# Patient Record
Sex: Female | Born: 1990 | Race: Black or African American | Hispanic: No | Marital: Single | Smoking: Former smoker
Health system: Southern US, Community
[De-identification: ages and names within clinical notes are randomized; demographics above are authoritative.]

## PROBLEM LIST (undated history)

## (undated) ENCOUNTER — Inpatient Hospital Stay (HOSPITAL_COMMUNITY): Payer: Self-pay

## (undated) DIAGNOSIS — Z8679 Personal history of other diseases of the circulatory system: Secondary | ICD-10-CM

## (undated) DIAGNOSIS — L0390 Cellulitis, unspecified: Secondary | ICD-10-CM

## (undated) DIAGNOSIS — N939 Abnormal uterine and vaginal bleeding, unspecified: Secondary | ICD-10-CM

## (undated) DIAGNOSIS — Z973 Presence of spectacles and contact lenses: Secondary | ICD-10-CM

## (undated) HISTORY — PX: NO PAST SURGERIES: SHX2092

---
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
## 2011-07-22 NOTE — L&D Delivery Note (Signed)
Delivery Note At 7:49 PM a viable and healthy female was delivered via Vaginal, Spontaneous Delivery (Presentation: ; Occiput Anterior).  APGAR: 8, 8; weight 5 lb 12.8 oz (2631 g).   Placenta status: Intact, Spontaneous.  Cord: 3 vessels with a loose nuchal cord that reduced with delivery of the body.   Patient delivered the infants head to crowning which was delivered by RN, I attended the delivery of the body.  The infant cried but was noted to have wet respiratory sounds and thick meconium was noted. The cord was clamped and cut and the infant was passed to the isolet and the waiting RN. The placenta delivered spontaneously, intact, with 3V cord.  A second degree laceration was repaired with 3-0 vicryl.  Mom is doing well after delivery and the infant was passed to the nursery d/t poor oxygen saturations.    Anesthesia: Epidural  Episiotomy: None Lacerations: 2nd degree Suture Repair: 3.0 vicryl Est. Blood Loss (mL):   Mom to postpartum.  Baby to nursery-stable.  Khalfani Weideman H. 03/31/2012, 8:24 PM

## 2011-08-18 ENCOUNTER — Emergency Department (HOSPITAL_COMMUNITY): Payer: Self-pay

## 2011-08-18 ENCOUNTER — Encounter (HOSPITAL_COMMUNITY): Payer: Self-pay

## 2011-08-18 ENCOUNTER — Emergency Department (HOSPITAL_COMMUNITY)
Admission: EM | Admit: 2011-08-18 | Discharge: 2011-08-18 | Disposition: A | Discharge status: Home / Self Care | Payer: Self-pay | Attending: Emergency Medicine | Admitting: Emergency Medicine

## 2011-08-18 DIAGNOSIS — R1033 Periumbilical pain: Secondary | ICD-10-CM | POA: Insufficient documentation

## 2011-08-18 DIAGNOSIS — O468X9 Other antepartum hemorrhage, unspecified trimester: Secondary | ICD-10-CM | POA: Insufficient documentation

## 2011-08-18 DIAGNOSIS — O418X90 Other specified disorders of amniotic fluid and membranes, unspecified trimester, not applicable or unspecified: Secondary | ICD-10-CM

## 2011-08-18 DIAGNOSIS — O200 Threatened abortion: Secondary | ICD-10-CM | POA: Insufficient documentation

## 2011-08-18 DIAGNOSIS — R10815 Periumbilic abdominal tenderness: Secondary | ICD-10-CM | POA: Insufficient documentation

## 2011-08-18 DIAGNOSIS — F17200 Nicotine dependence, unspecified, uncomplicated: Secondary | ICD-10-CM | POA: Insufficient documentation

## 2011-08-18 LAB — URINALYSIS, ROUTINE W REFLEX MICROSCOPIC
Glucose, UA: NEGATIVE mg/dL
Ketones, ur: 15 mg/dL — AB
Nitrite: NEGATIVE
Protein, ur: 30 mg/dL — AB
Specific Gravity, Urine: 1.029 (ref 1.005–1.030)
Urobilinogen, UA: 1 mg/dL (ref 0.0–1.0)
pH: 7.5 (ref 5.0–8.0)

## 2011-08-18 LAB — WET PREP, GENITAL
Trich, Wet Prep: NONE SEEN
Yeast Wet Prep HPF POC: NONE SEEN

## 2011-08-18 LAB — URINE MICROSCOPIC-ADD ON

## 2011-08-18 LAB — HCG, QUANTITATIVE, PREGNANCY: hCG, Beta Chain, Quant, S: 105857 m[IU]/mL — ABNORMAL HIGH (ref <5)

## 2011-08-18 LAB — TYPE AND SCREEN
ABO/RH(D): A POS
Antibody Screen: NEGATIVE

## 2011-08-18 LAB — ABO/RH: ABO/RH(D): A POS

## 2011-08-18 NOTE — ED Provider Notes (Signed)
History    21 year-old female with vaginal bleeding and abdominal pain. Onset this morning. Bright red blood and clots. Periumbilical crampy abdominal pain. Relatively constant. Patient states she is pregnant. Last menstrual period was November 26. Has not had OB follow-up yet. First pregnancy. Denies history of abdominal surgery. No urinary complaints. No dizziness, lightheadedness or SOB. No unusual bleeding anywhere else.  CSN: [id number]  Arrival date & time 08/18/11  1337   First MD Initiated Contact with Patient 08/18/11 1456      Chief Complaint  Patient presents with  . Vaginal Bleeding    (Consider location/radiation/quality/duration/timing/severity/associated sxs/prior treatment) HPI  History reviewed. No pertinent past medical history.  History reviewed. No pertinent past surgical history.  No family history on file.  History  Substance Use Topics  . Smoking status: Current Everyday Smoker  . Smokeless tobacco: Not on file  . Alcohol Use: Yes    OB History    Grav Para Term Preterm Abortions TAB SAB Ect Mult Living   1               Review of Systems  Review of symptoms negative unless otherwise noted in HPI.  Allergies  Review of patient's allergies indicates no known allergies.  Home Medications   Current Outpatient Rx  Name Route Sig Dispense Refill  . PRENATAL PO Oral Take 1 tablet by mouth daily.      BP 115/66  Pulse 97  Temp(Src) 98.1 F (36.7 C) (Oral)  Resp 18  Ht 5\' 3"  (1.6 m)  Wt 120 lb (54.432 kg)  BMI 21.26 kg/m2  SpO2 99%  LMP 06/16/2011  Physical Exam  Nursing note and vitals reviewed. Constitutional: She appears well-developed and well-nourished. No distress.       Sitting up in bed. No acute distress  HENT:  Head: Normocephalic and atraumatic.  Eyes: Conjunctivae are normal. Right eye exhibits no discharge. Left eye exhibits no discharge.  Neck: Neck supple.  Cardiovascular: Normal rate, regular rhythm and normal heart  sounds.  Exam reveals no gallop and no friction rub.   No murmur heard. Pulmonary/Chest: Effort normal and breath sounds normal. No respiratory distress.  Abdominal: Soft. She exhibits no distension. There is tenderness.       Abdomen normal to inspection. Mild periumbilical tenderness without rebound or guarding. No distention.  Genitourinary:       New CVA tenderness. Normal external female genitalia. No concerning lesions noted. Scant whitish vaginal discharge, likely physiologic. Cervix closed & without lesion. No blood at os noted. No blood noted and actually fall. No adnexal masses palpated. No CMT.  Musculoskeletal: She exhibits no edema and no tenderness.  Neurological: She is alert.  Skin: Skin is warm and dry.  Psychiatric: She has a normal mood and affect. Her behavior is normal. Thought content normal.    ED Course  Procedures (including critical care time)  Labs Reviewed  HCG, QUANTITATIVE, PREGNANCY - Abnormal; Notable for the following:    hCG, Beta Chain, [place] Longest [id number] (*)    All other components within normal limits  URINALYSIS, ROUTINE W REFLEX MICROSCOPIC - Abnormal; Notable for the following:    APPearance CLOUDY (*)    Hgb urine dipstick LARGE (*)    Bilirubin Urine SMALL (*)    Ketones, ur 15 (*)    Protein, ur 30 (*)    Leukocytes, UA MODERATE (*)    All other components within normal limits  URINE MICROSCOPIC-ADD ON - Abnormal; Notable for the  following:    Squamous Epithelial / LPF MANY (*)    Bacteria, UA MANY (*)    Casts HYALINE CASTS (*)    All other components within normal limits  WET PREP, GENITAL - Abnormal; Notable for the following:    Clue Cells, Wet Prep FEW (*)    WBC, Wet Prep HPF POC FEW (*)    All other components within normal limits  TYPE AND SCREEN  GC/CHLAMYDIA PROBE AMP, GENITAL  ABO/RH   US Ob Comp Less 14 Wks  08/18/2011  *RADIOLOGY REPORT*  Clinical Data: Pain and bleeding for 1 day, pregnant; quantitative beta HCG 105,157   OBSTETRIC <14 WK [place] AND TRANSVAGINAL OB US  Technique:  Both transabdominal and transvaginal ultrasound examinations were performed for complete evaluation of the gestation as well as the maternal uterus, adnexal regions, and pelvic cul-de-sac.  Transvaginal technique was performed to assess early pregnancy.  Comparison:  None.  Intrauterine gestational sac:  Visualized/normal in shape. Yolk sac: Present Embryo: Present Cardiac Activity: Present Heart Rate: 162 bpm  CRL: 15.6 mm     8 w  0 d           [place] EDC: 03/29/2012  Maternal uterus/adnexae: Small subchorionic hemorrhage. Right ovary measures 3.4 x 3.9 x 3.1 cm and contains a small complicated nodule 1.6 x 1.2 x 1.4 cm, question hemorrhagic corpus luteal cyst. Left ovary normal size and morphology, 2.0 x 1.6 x 3.0 cm. Small amount of nonspecific free pelvic fluid.  IMPRESSION: Single live early intrauterine gestation measured at 8 weeks 0 days EGA. Small subchorionic hemorrhage.  Original Report Authenticated By: Lollie Marrow, M.D.   US Ob Transvaginal  08/18/2011  *RADIOLOGY REPORT*  Clinical Data: Pain and bleeding for 1 day, pregnant; quantitative beta HCG 105,157  OBSTETRIC <14 WK [place] AND TRANSVAGINAL OB US  Technique:  Both transabdominal and transvaginal ultrasound examinations were performed for complete evaluation of the gestation as well as the maternal uterus, adnexal regions, and pelvic cul-de-sac.  Transvaginal technique was performed to assess early pregnancy.  Comparison:  None.  Intrauterine gestational sac:  Visualized/normal in shape. Yolk sac: Present Embryo: Present Cardiac Activity: Present Heart Rate: 162 bpm  CRL: 15.6 mm     8 w  0 d           [place] EDC: 03/29/2012  Maternal uterus/adnexae: Small subchorionic hemorrhage. Right ovary measures 3.4 x 3.9 x 3.1 cm and contains a small complicated nodule 1.6 x 1.2 x 1.4 cm, question hemorrhagic corpus luteal cyst. Left ovary normal size and morphology, 2.0 x 1.6 x 3.0 cm. Small amount of  nonspecific free pelvic fluid.  IMPRESSION: Single live early intrauterine gestation measured at 8 weeks 0 days EGA. Small subchorionic hemorrhage.  Original Report Authenticated By: Lollie Marrow, M.D.     1. Threatened abortion   2. Subchorionic hemorrhage       MDM  21 year old female with vaginal bleeding. First trimester pregnancy. Ultrasound performed which showed a single live intrauterine pregnancy. Patient seemed medically stable. Patient is Rh+. Reasonable expected management discussed. Continued prenatal care. Refill. Return precautions discussed.       Raeford Razor, MD 08/27/11 [phone]

## 2011-08-18 NOTE — ED Notes (Signed)
Pelvic cart set up in CDU room 11

## 2011-08-18 NOTE — ED Notes (Signed)
Pt. Is [redacted] weeks pregnant and is  Began having mild bleeding and cramping,  Pt. Reports. "I have seen blood clots"

## 2011-08-19 LAB — GC/CHLAMYDIA PROBE AMP, GENITAL
Chlamydia, DNA Probe: NEGATIVE
GC Probe Amp, Genital: NEGATIVE

## 2011-09-04 ENCOUNTER — Encounter (HOSPITAL_COMMUNITY): Payer: Self-pay

## 2011-09-04 ENCOUNTER — Emergency Department (INDEPENDENT_AMBULATORY_CARE_PROVIDER_SITE_OTHER)
Admission: EM | Admit: 2011-09-04 | Discharge: 2011-09-04 | Disposition: A | Discharge status: Home / Self Care | Payer: Medicaid Other | Source: Home / Self Care | Attending: Emergency Medicine | Admitting: Emergency Medicine

## 2011-09-04 DIAGNOSIS — O26899 Other specified pregnancy related conditions, unspecified trimester: Secondary | ICD-10-CM

## 2011-09-04 DIAGNOSIS — O2686 Pruritic urticarial papules and plaques of pregnancy (PUPPP): Secondary | ICD-10-CM

## 2011-09-04 DIAGNOSIS — L988 Other specified disorders of the skin and subcutaneous tissue: Secondary | ICD-10-CM

## 2011-09-04 NOTE — ED Notes (Signed)
Pt c/o rash on entire body.  SX onset 1 month ago.  Pt states it originated on her hands and spread to rest of body.  Pt states she TX with Cortisone 10 with minimal relief.  Pt states she is [redacted] wks pregnant.

## 2011-09-04 NOTE — ED Provider Notes (Signed)
Chief Complaint  Patient presents with  . Rash    History of Present Illness:   Bailey Baker is a 43 to who is about [redacted] weeks pregnant. For the past month she's had a generalized, pruritic rash. This first began on her hands and now has spread to her entire body. She describes pruritic papules and extreme itching. She denies any history of eczema. She's not taking any new medications. Her only medical issue right now is her pregnancy. She's had no suspicious exposures. No one else at home or in her family has had a similar rash.  Review of Systems:  Other than noted above, the patient denies any of the following symptoms: Systemic:  No fever, chills, sweats, weight loss, or fatigue. ENT:  No nasal congestion, rhinorrhea, sore throat, swelling of lips, tongue or throat. Resp:  No cough, wheezing, or shortness of breath. Skin:  No rash, itching, nodules, or suspicious lesions.  PMFSH:  Past medical history, family history, social history, meds, and allergies were reviewed.  Physical Exam:   Vital signs:  BP 129/75  Pulse 116  Temp(Src) 98.8 F (37.1 C) (Oral)  Resp 16  SpO2 100%  LMP 06/16/2011 Gen:  Alert, oriented, in no distress. Skin:  Her skin shows a generalized maculopapular rash on her hands, arms, neck, and trunk. She has a few on her face, and no visible lesions on her legs but she says the legs are still very itchy. Many of these are eczematized.  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  The following meds were prescribed:   New Prescriptions   No medications on file   2.  The patient was instructed in symptomatic care and handouts were given. 3.  The patient was told to return if becoming worse in any way, if no better in 3 or 4 days, and given some red flag symptoms that would indicate earlier  return. 4.   She was told to use diphenhydramine 25 mg every 4 hours as needed for the itching and use a mild moisturizing ointment such as Aquaphor after bathing as well as use of a mild soap. She was told to followup with her gynecologist at her next visit.     Roque Lias, MD 09/04/11 [phone]

## 2011-09-04 NOTE — Discharge Instructions (Signed)
Pruritic Urticarial Papules and Plaques of Pregnancy  When you are pregnant, your body changes in many ways. That includes the skin. Rashes sometimes develop. The most common skin rash during pregnancy is called pruritic urticarial papules and plaques of pregnancy (PUPPP). The small red bumps sometimes form large plaques. These are very itchy. The rash usually appears in the last few weeks of pregnancy during the third trimester. Sometimes, it can occur shortly after giving birth. It goes away within 1 to 2 months after delivery. It is not harmful. You may not like the way it looks or feels. However, there is no reason to believe that it does any harm to your baby. It will not leave scars on your skin.  CAUSES   Exactly why this skin rash develops is not known.   Your skin stretches rapidly when you are pregnant. This may cause PUPPP. Stretching happens most over the belly. Skin also stretches in other areas, including the legs, hips, and breasts.   Some women are more likely than others to develop PUPPP. They include:   Those who are having a baby for the first time.   Women who gain too much weight.   Those who are carrying more than 1 baby.  SYMPTOMS    Appearance of a red, raised rash.   It might look like hives or an allergic reaction.   Sometimes, there are tiny blisters in the center of the patches.   The skin around the rash is often pale.   The rash is usually on the belly. It can spread to other parts of the body, such as the thighs or arms.   Severe itching.  DIAGNOSIS   To decide if you have PUPPP, your caregiver may:   Ask about symptoms you have noticed.   Ask about your overall health, today and in the past.   Ask about your pregnancy.   Ask whether you have been near anything that could cause an allergic reaction.   Examine the skin on parts of the body that concern you.   Order a biopsy. A small sample of skin tissue is removed and looked at under a microscope.   Order blood tests.  These may rule out other reasons for a rash.  TREATMENT   The goal is to stop the itching and keep the rash from spreading. Usually, a cream is used to do this. However, treatment varies. Common options include:   Topical corticosteroids. These are powerful drugs that can help stop itching.   Usually, the drug is used as a cream or ointment. It is put directly on the rash.   Sometimes, a stronger oral corticosteroid is needed. This is taken as a pill.   Antihistamines. These drugs are sometimes given for PUPPP. They are oral medicines. They are normally used for allergies, but they may help with the intense itching that can be seen in PUPPP.  Treatment helps nearly all women with PUPPP. The creams or pills should make your skin feel better fairly quickly. The rash and itching should be gone a few months after your baby is born.  HOME CARE INSTRUCTIONS    Do not scratch the rash.   Wear loose clothing.   Apply any creams that your caregiver prescribed. Follow the directions carefully.   Keep all follow-up appointments with your caregiver. This will allow your caregiver to make sure your treatment is working.   Learn more about skin care. You might want to meet with a skin   while it stretches during pregnancy. Also, ask about choosing skin products that are right for you.  SEEK MEDICAL CARE IF:   You have any questions about your medicines.   The itching continues or the rash continues to spread.   You continue to be uncomfortable or unable to sleep. Your caregiver may want to change your medicine.  Document Released: 10/01/2009 Document Revised: 03/19/2011 Document Reviewed: 10/01/2009 [facility] Patient Information 2012 [place], [place].   For itching take diphenhydramine 25 mg every 4 hours as needed.  Apply Aquphore ointment to skin daily after bathing and use mild soap such as [employer].

## 2011-09-15 ENCOUNTER — Encounter (HOSPITAL_COMMUNITY): Payer: Self-pay | Admitting: Emergency Medicine

## 2011-09-15 ENCOUNTER — Emergency Department (HOSPITAL_COMMUNITY)
Admission: EM | Admit: 2011-09-15 | Discharge: 2011-09-15 | Disposition: A | Discharge status: Home / Self Care | Payer: Medicaid Other | Attending: Emergency Medicine | Admitting: Emergency Medicine

## 2011-09-15 DIAGNOSIS — L0390 Cellulitis, unspecified: Secondary | ICD-10-CM

## 2011-09-15 DIAGNOSIS — B86 Scabies: Secondary | ICD-10-CM | POA: Insufficient documentation

## 2011-09-15 DIAGNOSIS — R21 Rash and other nonspecific skin eruption: Secondary | ICD-10-CM | POA: Insufficient documentation

## 2011-09-15 DIAGNOSIS — L299 Pruritus, unspecified: Secondary | ICD-10-CM | POA: Insufficient documentation

## 2011-09-15 DIAGNOSIS — L0291 Cutaneous abscess, unspecified: Secondary | ICD-10-CM | POA: Insufficient documentation

## 2011-09-15 DIAGNOSIS — O99891 Other specified diseases and conditions complicating pregnancy: Secondary | ICD-10-CM | POA: Insufficient documentation

## 2011-09-15 MED ORDER — CEPHALEXIN 250 MG PO CAPS: 250.0000 mg | ORAL_CAPSULE | Freq: Four times a day (QID) | ORAL | Status: AC

## 2011-09-15 MED ORDER — PERMETHRIN 5 % EX CREA: TOPICAL_CREAM | CUTANEOUS | Status: AC

## 2011-09-15 NOTE — ED Provider Notes (Signed)
Medical screening examination/treatment/procedure(s) were performed by non-physician practitioner and as supervising physician I was immediately available for consultation/collaboration.  Shelda Jakes, MD 09/15/11 [phone]

## 2011-09-15 NOTE — ED Notes (Signed)
Pt c/o rash to left arm, stomach and buttocks that is painful; pt denies drainage; pt sts x several weeks

## 2011-09-15 NOTE — ED Notes (Signed)
Reports rash started initially to hands & has spread everywhere else. States there are other family members with same.

## 2011-09-15 NOTE — Discharge Instructions (Signed)
Scabies Scabies are small bugs (mites) that burrow under the skin and cause red bumps and severe itching. These bugs can only be seen with a microscope. Scabies are highly contagious. They can spread easily from person to person by direct contact. They are also spread through sharing clothing or linens that have the scabies mites living in them. It is not unusual for an entire family to become infected through shared towels, clothing, or bedding.  HOME CARE INSTRUCTIONS   Your caregiver may prescribe a cream or lotion to kill the mites. If this cream is prescribed; massage the cream into the entire area of the body from the neck to the bottom of both feet. Also massage the cream into the scalp and face if your child is less than 20 year old. Avoid the eyes and mouth.   Leave the cream on for 8 to12 hours. Do not wash your hands after application. Your child should bathe or shower after the 8 to 12 hour application period. Sometimes it is helpful to apply the cream to your child at right before bedtime.   One treatment is usually effective and will eliminate approximately 95% of infestations. For severe cases, your caregiver may decide to repeat the treatment in 1 week. Everyone in your household should be treated with one application of the cream.   New rashes or burrows should not appear after successful treatment within 24 to 48 hours; however the itching and rash may last for 2 to 4 weeks after successful treatment. If your symptoms persist longer than this, see your caregiver.   Your caregiver also may prescribe a medication to help with the itching or to help the rash go away more quickly.   Scabies can live on clothing or linens for up to 3 days. Your entire child's recently used clothing, towels, stuffed toys, and bed linens should be washed in hot water and then dried in a dryer for at least 20 minutes on high heat. Items that cannot be washed should be enclosed in a plastic bag for at least 3  days.   To help relieve itching, bathe your child in a cool bath or apply cool washcloths to the affected areas.   Your child may return to school after treatment with the prescribed cream.  SEEK MEDICAL CARE IF:   The itching persists longer than 4 weeks after treatment.   The rash spreads or becomes infected (the area has red blisters or yellow-tan crust).  Document Released: 07/07/2005 Document Revised: 03/19/2011 Document Reviewed: 11/15/2008 [facility] Patient Information 2012 [place], [place].  Cellulitis Cellulitis is an infection of the skin and the tissue beneath it. The area is typically red and tender. It is caused by germs (bacteria) (usually staph or strep) that enter the body through cuts or sores. Cellulitis most commonly occurs in the arms or lower legs.  HOME CARE INSTRUCTIONS   If you are given a prescription for medications which kill germs (antibiotics), take as directed until finished.   If the infection is on the arm or leg, keep the limb elevated as able.   Use a warm cloth several times per day to relieve pain and encourage healing.   See your caregiver for recheck of the infected site as directed if problems arise.   Only take over-the-counter or prescription medicines for pain, discomfort, or fever as directed by your caregiver.  SEEK MEDICAL CARE IF:   The area of redness (inflammation) is spreading, there are red streaks coming from the  infected site, or if a part of the infection begins to turn dark in color.   The joint or bone underneath the infected skin becomes painful after the skin has healed.   The infection returns in the same or another area after it seems to have gone away.   A boil or bump swells up. This may be an abscess.   New, unexplained problems such as pain or fever develop.  SEEK IMMEDIATE MEDICAL CARE IF:   You have a fever.   You or your child feels drowsy or lethargic.   There is vomiting, diarrhea, or lasting discomfort or  feeling ill (malaise) with muscle aches and pains.  MAKE SURE YOU:   Understand these instructions.   Will watch your condition.   Will get help right away if you are not doing well or get worse.  Document Released: 04/16/2005 Document Revised: 03/19/2011 Document Reviewed: 02/23/2008 [facility] Patient Information 2012 [place], [place].

## 2011-09-15 NOTE — ED Provider Notes (Signed)
History     CSN: [id number]  Arrival date & time 09/15/11  5621   First MD Initiated Contact with Patient 09/15/11 [phone]      No chief complaint on file.   (Consider location/radiation/quality/duration/timing/severity/associated sxs/prior treatment) Patient is a 21 y.o. female presenting with rash. The history is provided by the patient. No language interpreter was used.  Rash     21 year old female who is [redacted] weeks pregnant is presenting to the ED with chief complaints of rash. Patient states for the past one to 2 months she has been experiencing a generalized pruritic rash throughout the body. Rash initially started from both hands and then spread throughout her body. Rash is very itchy. She now noticed several area of rash on her body this not only itchy but tender to touch. Patient denies any medication changes have a new pets, or new environmental changes. Patient denies fever, nausea, vomiting, lip swelling, cp, sob, abd pain. There is similar rash noted to other family members.  Patient states last week she was seen at urgent care for her rash, and was diagnosed with PUPPP.  She was given topical steroid and Aquafor cream but sts it has not improved.    Past Medical History  Diagnosis Date  . Asthma     No past surgical history on file.  No family history on file.  History  Substance Use Topics  . Smoking status: Former Games developer  . Smokeless tobacco: Not on file  . Alcohol Use: No    OB History    Grav Para Term Preterm Abortions TAB SAB Ect Mult Living   1               Review of Systems  Skin: Positive for rash.  All other systems reviewed and are negative.    Allergies  Review of patient's allergies indicates no known allergies.  Home Medications   Current Outpatient Rx  Name Route Sig Dispense Refill  . DIPHENHYDRAMINE HCL 12.5 MG/5ML PO LIQD Oral Take 25 mg by mouth every 4 (four) hours as needed. As needed for rash and itching.    Marland Kitchen PRENATAL PO Oral  Take 1 tablet by mouth daily.      LMP 06/16/2011  Physical Exam  Nursing note and vitals reviewed. Constitutional: She appears well-developed and well-nourished. No distress.  HENT:  Head: Normocephalic and atraumatic.  Mouth/Throat: Oropharynx is clear and moist. No oropharyngeal exudate.       No oral mucosal edema. Non-petechial  Eyes: Conjunctivae are normal.  Neck: Normal range of motion. Neck supple.  Musculoskeletal: Normal range of motion.  Lymphadenopathy:    She has no cervical adenopathy.  Neurological: She is alert.  Skin: Skin is warm and dry.        diffuse maculopapular rash noted to the webspace of hands bilaterally, forearms, chest, back, buttocks, and lower legs. Nonpustular, non-petechial, non-plaque.  Psychiatric: She has a normal mood and affect.    ED Course  Procedures (including critical care time)  Labs Reviewed - No data to display No results found.   No diagnosis found.    MDM  Rash during pregnancy. Initially it was thought to be caused by PUPPP.  However, with similar rash presentation in other family members, and rash manifesting on the webspace of fingers, i think it is likely scabies vs. Bed bugs.  There are some evidence of superficial skin infection, especially to L buttock from scratching.  Will treat with Permethrin and also with Keflex.  Doubt allergic rxn.  Referral given        Fayrene Helper, PA-C 09/15/11 2956

## 2011-09-17 ENCOUNTER — Encounter (HOSPITAL_COMMUNITY): Payer: Self-pay | Admitting: *Deleted

## 2011-09-17 ENCOUNTER — Emergency Department (HOSPITAL_COMMUNITY)
Admission: EM | Admit: 2011-09-17 | Discharge: 2011-09-17 | Discharge status: Against Medical Advice | Payer: Medicaid Other | Attending: Emergency Medicine | Admitting: Emergency Medicine

## 2011-09-17 DIAGNOSIS — R238 Other skin changes: Secondary | ICD-10-CM | POA: Insufficient documentation

## 2011-09-17 NOTE — ED Notes (Signed)
Reports having abscess on buttock since Monday. Pt is [redacted] weeks pregnant. No acute distress noted at triage.

## 2011-09-17 NOTE — ED Notes (Signed)
Pt called for reexamination of VS. No response, pt not in room

## 2011-09-24 LAB — OB RESULTS CONSOLE ABO/RH: RH Type: POSITIVE

## 2011-09-24 LAB — OB RESULTS CONSOLE HEPATITIS B SURFACE ANTIGEN: Hepatitis B Surface Ag: NEGATIVE

## 2011-09-24 LAB — OB RESULTS CONSOLE RPR: RPR: NONREACTIVE

## 2012-03-04 LAB — OB RESULTS CONSOLE GBS: GBS: NEGATIVE

## 2012-03-30 ENCOUNTER — Inpatient Hospital Stay (HOSPITAL_COMMUNITY)
Admission: AD | Admit: 2012-03-30 | Discharge: 2012-03-30 | Disposition: A | Discharge status: Home / Self Care | Payer: Medicaid Other | Source: Ambulatory Visit | Attending: Obstetrics and Gynecology | Admitting: Obstetrics and Gynecology

## 2012-03-30 ENCOUNTER — Encounter (HOSPITAL_COMMUNITY): Payer: Self-pay | Admitting: *Deleted

## 2012-03-30 DIAGNOSIS — O479 False labor, unspecified: Secondary | ICD-10-CM | POA: Insufficient documentation

## 2012-03-30 NOTE — MAU Note (Signed)
Contractions q 5-6 minutes, denies bleeding or ROM

## 2012-03-31 ENCOUNTER — Inpatient Hospital Stay (HOSPITAL_COMMUNITY): Payer: Medicaid Other | Admitting: Anesthesiology

## 2012-03-31 ENCOUNTER — Encounter (HOSPITAL_COMMUNITY): Payer: Self-pay | Admitting: Anesthesiology

## 2012-03-31 ENCOUNTER — Inpatient Hospital Stay (HOSPITAL_COMMUNITY)
Admission: AD | Admit: 2012-03-31 | Discharge: 2012-04-02 | DRG: 775 | Disposition: A | Discharge status: Home / Self Care | Payer: Medicaid Other | Source: Ambulatory Visit | Attending: Obstetrics and Gynecology | Admitting: Obstetrics and Gynecology

## 2012-03-31 ENCOUNTER — Encounter (HOSPITAL_COMMUNITY): Payer: Self-pay

## 2012-03-31 ENCOUNTER — Inpatient Hospital Stay (HOSPITAL_COMMUNITY): Payer: Medicaid Other

## 2012-03-31 ENCOUNTER — Encounter (HOSPITAL_COMMUNITY): Payer: Self-pay | Admitting: *Deleted

## 2012-03-31 DIAGNOSIS — Z3480 Encounter for supervision of other normal pregnancy, unspecified trimester: Secondary | ICD-10-CM

## 2012-03-31 DIAGNOSIS — O480 Post-term pregnancy: Secondary | ICD-10-CM | POA: Diagnosis present

## 2012-03-31 LAB — CBC
HCT: 37.4 % (ref 36.0–46.0)
Platelets: 180 10*3/uL (ref 150–400)
RBC: 4.17 MIL/uL (ref 3.87–5.11)
RDW: 12.9 % (ref 11.5–15.5)
WBC: 8.4 10*3/uL (ref 4.0–10.5)

## 2012-03-31 MED ORDER — BENZOCAINE-MENTHOL 20-0.5 % EX AERO
1.0000 "application " | INHALATION_SPRAY | CUTANEOUS | Status: DC | PRN
  Administered 2012-04-01: 1 via TOPICAL
  Filled 2012-03-31: qty 56

## 2012-03-31 MED ORDER — ZOLPIDEM TARTRATE 5 MG PO TABS: 5.0000 mg | ORAL_TABLET | Freq: Every evening | ORAL | Status: DC | PRN

## 2012-03-31 MED ORDER — PRENATAL MULTIVITAMIN CH
1.0000 | ORAL_TABLET | Freq: Every day | ORAL | Status: DC
  Administered 2012-04-01: 1 via ORAL
  Filled 2012-03-31 (×2): qty 1

## 2012-03-31 MED ORDER — EPHEDRINE 5 MG/ML INJ
10.0000 mg | INTRAVENOUS | Status: DC | PRN
  Filled 2012-03-31: qty 4

## 2012-03-31 MED ORDER — SIMETHICONE 80 MG PO CHEW: 80.0000 mg | CHEWABLE_TABLET | ORAL | Status: DC | PRN

## 2012-03-31 MED ORDER — LIDOCAINE HCL (PF) 1 % IJ SOLN
INTRAMUSCULAR | Status: DC | PRN
  Administered 2012-03-31 (×2): 9 mL

## 2012-03-31 MED ORDER — ONDANSETRON HCL 4 MG/2ML IJ SOLN
4.0000 mg | Freq: Four times a day (QID) | INTRAMUSCULAR | Status: DC | PRN
  Administered 2012-03-31: 4 mg via INTRAVENOUS
  Filled 2012-03-31: qty 2

## 2012-03-31 MED ORDER — FENTANYL 2.5 MCG/ML BUPIVACAINE 1/10 % EPIDURAL INFUSION (WH - ANES)
INTRAMUSCULAR | Status: DC | PRN
  Administered 2012-03-31: 14 mL/h via EPIDURAL

## 2012-03-31 MED ORDER — IBUPROFEN 600 MG PO TABS
600.0000 mg | ORAL_TABLET | Freq: Four times a day (QID) | ORAL | Status: DC
  Administered 2012-03-31 – 2012-04-02 (×6): 600 mg via ORAL
  Filled 2012-03-31 (×6): qty 1

## 2012-03-31 MED ORDER — EPHEDRINE 5 MG/ML INJ: 10.0000 mg | INTRAVENOUS | Status: DC | PRN

## 2012-03-31 MED ORDER — DIPHENHYDRAMINE HCL 25 MG PO CAPS: 25.0000 mg | ORAL_CAPSULE | Freq: Four times a day (QID) | ORAL | Status: DC | PRN

## 2012-03-31 MED ORDER — ONDANSETRON HCL 4 MG PO TABS: 4.0000 mg | ORAL_TABLET | ORAL | Status: DC | PRN

## 2012-03-31 MED ORDER — TERBUTALINE SULFATE 1 MG/ML IJ SOLN: 0.2500 mg | Freq: Once | INTRAMUSCULAR | Status: DC | PRN

## 2012-03-31 MED ORDER — OXYCODONE-ACETAMINOPHEN 5-325 MG PO TABS
1.0000 | ORAL_TABLET | ORAL | Status: DC | PRN
  Administered 2012-04-01 – 2012-04-02 (×2): 1 via ORAL
  Filled 2012-03-31 (×3): qty 1

## 2012-03-31 MED ORDER — FLEET ENEMA 7-19 GM/118ML RE ENEM: 1.0000 | ENEMA | RECTAL | Status: DC | PRN

## 2012-03-31 MED ORDER — IBUPROFEN 600 MG PO TABS: 600.0000 mg | ORAL_TABLET | Freq: Four times a day (QID) | ORAL | Status: DC | PRN

## 2012-03-31 MED ORDER — LIDOCAINE HCL (PF) 1 % IJ SOLN
30.0000 mL | INTRAMUSCULAR | Status: DC | PRN
  Filled 2012-03-31: qty 30

## 2012-03-31 MED ORDER — LACTATED RINGERS IV SOLN
500.0000 mL | INTRAVENOUS | Status: DC | PRN
  Administered 2012-03-31: 500 mL via INTRAVENOUS
  Administered 2012-03-31: 300 mL via INTRAVENOUS

## 2012-03-31 MED ORDER — LANOLIN HYDROUS EX OINT: TOPICAL_OINTMENT | CUTANEOUS | Status: DC | PRN

## 2012-03-31 MED ORDER — OXYCODONE-ACETAMINOPHEN 5-325 MG PO TABS
1.0000 | ORAL_TABLET | ORAL | Status: DC | PRN
Start: 2012-03-31 — End: 2012-03-31

## 2012-03-31 MED ORDER — OXYTOCIN 40 UNITS IN LACTATED RINGERS INFUSION - SIMPLE MED
1.0000 m[IU]/min | INTRAVENOUS | Status: DC
  Administered 2012-03-31: 2 m[IU]/min via INTRAVENOUS
  Filled 2012-03-31: qty 1000

## 2012-03-31 MED ORDER — ACETAMINOPHEN 325 MG PO TABS: 650.0000 mg | ORAL_TABLET | ORAL | Status: DC | PRN

## 2012-03-31 MED ORDER — METHYLERGONOVINE MALEATE 0.2 MG PO TABS: 0.2000 mg | ORAL_TABLET | ORAL | Status: DC | PRN

## 2012-03-31 MED ORDER — DIPHENHYDRAMINE HCL 50 MG/ML IJ SOLN: 12.5000 mg | INTRAMUSCULAR | Status: DC | PRN

## 2012-03-31 MED ORDER — WITCH HAZEL-GLYCERIN EX PADS: 1.0000 "application " | MEDICATED_PAD | CUTANEOUS | Status: DC | PRN

## 2012-03-31 MED ORDER — METHYLERGONOVINE MALEATE 0.2 MG/ML IJ SOLN: 0.2000 mg | INTRAMUSCULAR | Status: DC | PRN

## 2012-03-31 MED ORDER — ONDANSETRON HCL 4 MG/2ML IJ SOLN: 4.0000 mg | INTRAMUSCULAR | Status: DC | PRN

## 2012-03-31 MED ORDER — OXYTOCIN BOLUS FROM INFUSION
500.0000 mL | Freq: Once | INTRAVENOUS | Status: DC
  Filled 2012-03-31: qty 500

## 2012-03-31 MED ORDER — PHENYLEPHRINE 40 MCG/ML (10ML) SYRINGE FOR IV PUSH (FOR BLOOD PRESSURE SUPPORT)
80.0000 ug | PREFILLED_SYRINGE | INTRAVENOUS | Status: DC | PRN
  Filled 2012-03-31: qty 5

## 2012-03-31 MED ORDER — DIBUCAINE 1 % RE OINT: 1.0000 "application " | TOPICAL_OINTMENT | RECTAL | Status: DC | PRN

## 2012-03-31 MED ORDER — PHENYLEPHRINE 40 MCG/ML (10ML) SYRINGE FOR IV PUSH (FOR BLOOD PRESSURE SUPPORT): 80.0000 ug | PREFILLED_SYRINGE | INTRAVENOUS | Status: DC | PRN

## 2012-03-31 MED ORDER — TETANUS-DIPHTH-ACELL PERTUSSIS 5-2.5-18.5 LF-MCG/0.5 IM SUSP
0.5000 mL | Freq: Once | INTRAMUSCULAR | Status: AC
  Administered 2012-04-01: 0.5 mL via INTRAMUSCULAR
  Filled 2012-03-31: qty 0.5

## 2012-03-31 MED ORDER — FENTANYL 2.5 MCG/ML BUPIVACAINE 1/10 % EPIDURAL INFUSION (WH - ANES)
14.0000 mL/h | INTRAMUSCULAR | Status: DC
  Administered 2012-03-31 (×2): 14 mL/h via EPIDURAL
  Filled 2012-03-31 (×3): qty 60

## 2012-03-31 MED ORDER — LACTATED RINGERS IV SOLN
INTRAVENOUS | Status: DC
  Administered 2012-03-31 (×5): via INTRAVENOUS

## 2012-03-31 MED ORDER — LACTATED RINGERS IV SOLN
500.0000 mL | Freq: Once | INTRAVENOUS | Status: AC
  Administered 2012-03-31: 500 mL via INTRAVENOUS

## 2012-03-31 MED ORDER — ALBUTEROL SULFATE HFA 108 (90 BASE) MCG/ACT IN AERS: 2.0000 | INHALATION_SPRAY | Freq: Four times a day (QID) | RESPIRATORY_TRACT | Status: DC | PRN

## 2012-03-31 MED ORDER — CITRIC ACID-SODIUM CITRATE 334-500 MG/5ML PO SOLN: 30.0000 mL | ORAL | Status: DC | PRN

## 2012-03-31 MED ORDER — SENNOSIDES-DOCUSATE SODIUM 8.6-50 MG PO TABS
2.0000 | ORAL_TABLET | Freq: Every day | ORAL | Status: DC
  Administered 2012-04-01: 2 via ORAL

## 2012-03-31 MED ORDER — OXYTOCIN 40 UNITS IN LACTATED RINGERS INFUSION - SIMPLE MED
62.5000 mL/h | Freq: Once | INTRAVENOUS | Status: AC
  Administered 2012-03-31: 62.5 mL/h via INTRAVENOUS

## 2012-03-31 NOTE — Progress Notes (Signed)
Patient ID: Bailey Baker, female   DOB: 03/03/1991, 21 y.o.   MRN: [id number]  S: Feeling contractions O:  Filed Vitals:   03/31/12 0159 03/31/12 0512 03/31/12 0647 03/31/12 0805  BP: 121/78 116/66 118/70 128/80  Pulse:  98 104 98  Temp: 97.6 F (36.4 C) 97.5 F (36.4 C) 98.2 F (36.8 C)   TempSrc: Oral Oral    Resp: 18 18 18 18    AOX3 CVX deferred FHT 140s with excellent variability, variable decels with contractions toco irregular  A/P: 21 yo G1P0 at 54+2 with low AFI & postdates 1) start pitocin augmentation 2) FWB reassuring, not reactive now but reassuring

## 2012-03-31 NOTE — Anesthesia Preprocedure Evaluation (Signed)
Anesthesia Evaluation  Patient identified by MRN, date of birth, ID band Patient awake    Reviewed: Allergy & Precautions, H&P , NPO status , Patient's Chart, lab work & pertinent test results  Airway Mallampati: I TM Distance: >3 FB Neck ROM: full    Dental No notable dental hx.    Pulmonary  breath sounds clear to auscultation  Pulmonary exam normal       Cardiovascular negative cardio ROS      Neuro/Psych negative neurological ROS  negative psych ROS   GI/Hepatic negative GI ROS, Neg liver ROS,   Endo/Other  negative endocrine ROS  Renal/GU negative Renal ROS  negative genitourinary   Musculoskeletal negative musculoskeletal ROS (+)   Abdominal Normal abdominal exam  (+)   Peds negative pediatric ROS (+)  Hematology negative hematology ROS (+)   Anesthesia Other Findings   Reproductive/Obstetrics (+) Pregnancy                           Anesthesia Physical Anesthesia Plan  ASA: II  Anesthesia Plan: Epidural   Post-op Pain Management:    Induction:   Airway Management Planned:   Additional Equipment:   Intra-op Plan:   Post-operative Plan:   Informed Consent: I have reviewed the patients History and Physical, chart, labs and discussed the procedure including the risks, benefits and alternatives for the proposed anesthesia with the patient or authorized representative who has indicated his/her understanding and acceptance.     Plan Discussed with:   Anesthesia Plan Comments:         Anesthesia Quick Evaluation

## 2012-03-31 NOTE — Progress Notes (Addendum)
Patient ID: Bailey Baker, female   DOB: August 02, 1990, 21 y.o.   MRN: [id number]  S: Comfortable after epidural O: AFVSS FHT 140s with accels, occassional variables, some with late component. Excellent scalp stim cvx 5-6/80/-1 A/P 1) Continue pit 2) Overall FWB reassuring

## 2012-03-31 NOTE — H&P (Signed)
21 y.o. [redacted]w[redacted]d  G1P0 comes in c/o ctx.  Otherwise has good fetal movement and no bleeding.  While being monitored pt was noted to have a late deceleration and a variable deceleration to the 60s.  A BPP was performed and found to be 6.8 with low fluid.  Design to admit was made.  Pt made small cervical change from 3.5 to 4 cm.  Past Medical History  Diagnosis Date  . Asthma     Past Surgical History  Procedure Date  . No past surgeries     OB History    Grav Para Term Preterm Abortions TAB SAB Ect Mult Living   1              # Outc Date GA Lbr Len/2nd Wgt Sex Del Anes PTL Lv   1 CUR               History   Social History  . Marital Status: Single    Spouse Name: N/A    Number of Children: N/A  . Years of Education: N/A   Occupational History  . Not on file.   Social History Main Topics  . Smoking status: Former Games developer  . Smokeless tobacco: Not on file  . Alcohol Use: No  . Drug Use: No  . Sexually Active: Not Currently   Other Topics Concern  . Not on file   Social History Narrative  . No narrative on file   Milk-related compounds   Prenatal Course:  Teen, FOB not involved, Asthma -stable.  Filed Vitals:   03/31/12 0159  BP: 121/78  Temp: 97.6 F (36.4 C)  Resp: 18     Lungs/Cor:  NAD Abdomen:  soft, gravid Ex:  no cords, erythema SVE:  4/70/-3 FHTs:  130 , good STV, not currently reactive Toco:  q4-5   A/P   Admit to L&D for labor, BPP6/8 with low fluid Recheck 2 hrs, if no change in cervix t/c pitocin augmentation Other routine care  GBS Neg  Ree Alcalde

## 2012-03-31 NOTE — Anesthesia Procedure Notes (Signed)
Epidural Patient location during procedure: OB Start time: 03/31/2012 11:08 AM End time: 03/31/2012 11:14 AM  Staffing Anesthesiologist: Sandrea Hughs Performed by: anesthesiologist   Preanesthetic Checklist Completed: patient identified, site marked, surgical consent, pre-op evaluation, timeout performed, IV checked, risks and benefits discussed and monitors and equipment checked  Epidural Patient position: sitting Prep: site prepped and draped and DuraPrep Patient monitoring: continuous pulse ox and blood pressure Approach: midline Injection technique: LOR air  Needle:  Needle type: Tuohy  Needle gauge: 17 G Needle length: 9 cm and 9 Needle insertion depth: 5 cm cm Catheter type: closed end flexible Catheter size: 19 Gauge Catheter at skin depth: 10 cm Test dose: negative and Other  Assessment Events: blood not aspirated, injection not painful, no injection resistance, negative IV test and no paresthesia  Additional Notes Reason for block:procedure for pain

## 2012-03-31 NOTE — Progress Notes (Signed)
Patient ID: Bailey Baker, female   DOB: [birth year], 21 y.o.   MRN: [id number]  S: Comfortable with epidural O: AFVSS FHT 140 with accels, variables cvx 5-6 toco irregular  AROM scan fluid with ? Meconium IUPC placed  A/P  1) Continue pit

## 2012-03-31 NOTE — MAU Note (Signed)
Contractions every 5 minutes x1 hour. Denies leaking of fluid or vaginal bleeding. Positive fetal movement.

## 2012-03-31 NOTE — Progress Notes (Signed)
Patient ID: Bailey Baker, female   DOB: [birth year], 21 y.o.   MRN: [id number]  S: Feeling ocassional pressure O: AFVSS cvx c/c/1+ FHT 140 + accels, runs of late decels, then improve. + scalp stim toco q2-4 A/P 1) Labor down, start pushing with pressure

## 2012-04-01 ENCOUNTER — Encounter (HOSPITAL_COMMUNITY): Payer: Self-pay | Admitting: *Deleted

## 2012-04-01 LAB — CBC
MCHC: 33.9 g/dL (ref 30.0–36.0)
RDW: 13.1 % (ref 11.5–15.5)

## 2012-04-01 NOTE — Progress Notes (Signed)
UR Chart review completed.  

## 2012-04-01 NOTE — Anesthesia Postprocedure Evaluation (Signed)
  Anesthesia Post-op Note  Patient: Bailey Baker  Procedure(s) Performed: * No procedures listed *  Patient Location: Mother/Baby  Anesthesia Type: Epidural  Level of Consciousness: awake  Airway and Oxygen Therapy: Patient Spontanous Breathing  Post-op Pain: none  Post-op Assessment: Patient's Cardiovascular Status Stable, Respiratory Function Stable, Patent Airway, No signs of Nausea or vomiting, Adequate PO intake, Pain level controlled, No headache, No backache, No residual numbness and No residual motor weakness  Post-op Vital Signs: Reviewed and stable  Complications: No apparent anesthesia complications

## 2012-04-01 NOTE — Progress Notes (Signed)
Patient is eating, ambulating, voiding.  Pain control is good.  Filed Vitals:   03/31/12 2158 03/31/12 2300 04/01/12 0334 04/01/12 0544  BP: 113/73 123/78 111/66 93/60  Pulse: 110 102 100 83  Temp: 99.1 F (37.3 C) 99.5 F (37.5 C) 98.2 F (36.8 C) 98 F (36.7 C)  TempSrc: Oral Oral Oral Oral  Resp: 18 18 18 18   Height:      Weight:      SpO2: 99% 99% 98%     Fundus firm Perineum without swelling.  Lab Results  Component Value Date   WBC 14.8* 04/01/2012   HGB 10.7* 04/01/2012   HCT 31.6* 04/01/2012   MCV 90.5 04/01/2012   PLT 143* 04/01/2012    A/Positive/-- (03/06 0000)/RI  A/P Post partum day 1.  Routine care.  Expect d/c tomorrow.    Elyssa Pendelton A

## 2012-04-02 MED ORDER — PNEUMOCOCCAL VAC POLYVALENT 25 MCG/0.5ML IJ INJ
0.5000 mL | INJECTION | INTRAMUSCULAR | Status: DC
  Filled 2012-04-02: qty 0.5

## 2012-04-02 MED ORDER — INFLUENZA VIRUS VACC SPLIT PF IM SUSP
0.5000 mL | INTRAMUSCULAR | Status: DC
  Filled 2012-04-02: qty 0.5

## 2012-04-02 MED ORDER — OXYCODONE-ACETAMINOPHEN 5-325 MG PO TABS: 1.0000 | ORAL_TABLET | ORAL | Status: AC | PRN

## 2012-04-02 NOTE — Discharge Summary (Signed)
Obstetric Discharge Summary Reason for Admission: onset of labor Prenatal Procedures: ultrasound Intrapartum Procedures: spontaneous vaginal delivery Postpartum Procedures: none Complications-Operative and Postpartum: 2 degree perineal laceration Hemoglobin  Date Value Range Status  04/01/2012 10.7* 12.0 - 15.0 g/dL Final     HCT  Date Value Range Status  04/01/2012 31.6* 36.0 - 46.0 % Final    Physical Exam:  General: alert Lochia: appropriate Uterine Fundus: firm  Discharge Diagnoses: Term Pregnancy-delivered  Discharge Information: Date: 04/02/2012 Activity: pelvic rest Diet: routine Medications: PNV, Ibuprofen and Percocet Condition: stable Instructions: refer to practice specific booklet Discharge to: home Follow-up Information    Follow up with Almon Hercules., MD. Schedule an appointment as soon as possible for a visit in 4 weeks.   Contact information:   [address] [place] [address] [phone]          Newborn Data: Live born female  Birth Weight: 5 lb 12.8 oz (2631 g) APGAR: 8, 8  Home with mother.  Gal Feldhaus E 04/02/2012, 8:33 AM

## 2012-04-02 NOTE — Progress Notes (Signed)
PPD#2 Pt with out complaints. Lochia-wnl. VSSAF IMP/stable PLAN/ discharge

## 2012-04-15 ENCOUNTER — Emergency Department (INDEPENDENT_AMBULATORY_CARE_PROVIDER_SITE_OTHER)
Admission: EM | Admit: 2012-04-15 | Discharge: 2012-04-15 | Disposition: A | Discharge status: Home / Self Care | Payer: Medicaid Other | Source: Home / Self Care | Attending: Emergency Medicine | Admitting: Emergency Medicine

## 2012-04-15 ENCOUNTER — Encounter (HOSPITAL_COMMUNITY): Payer: Self-pay | Admitting: Emergency Medicine

## 2012-04-15 DIAGNOSIS — L301 Dyshidrosis [pompholyx]: Secondary | ICD-10-CM

## 2012-04-15 DIAGNOSIS — L02419 Cutaneous abscess of limb, unspecified: Secondary | ICD-10-CM

## 2012-04-15 DIAGNOSIS — IMO0002 Reserved for concepts with insufficient information to code with codable children: Secondary | ICD-10-CM

## 2012-04-15 MED ORDER — SULFAMETHOXAZOLE-TRIMETHOPRIM 800-160 MG PO TABS: 1.0000 | ORAL_TABLET | Freq: Two times a day (BID) | ORAL | Status: AC

## 2012-04-15 MED ORDER — MOMETASONE FUROATE 0.1 % EX CREA: TOPICAL_CREAM | Freq: Every day | CUTANEOUS | Status: DC

## 2012-04-15 NOTE — ED Provider Notes (Signed)
History     CSN: [id number]  Arrival date & time 04/15/12  1252   First MD Initiated Contact with Patient 04/15/12 1305      Chief Complaint  Patient presents with  . Rash    (Consider location/radiation/quality/duration/timing/severity/associated sxs/prior treatment) HPI Comments: Patient been experiencing for many months on and off rashes appears her hands and feet with irritation and itchiness occasional patient describes that no other family members or household contacts have any further rashes or symptoms. Patient denies any further symptoms such as facial swelling difficulty swallowing or breathing and no other positive contacts. The rash is itchy and palpable he was of medicines. is  The history is provided by the patient.    Past Medical History  Diagnosis Date  . Asthma     Past Surgical History  Procedure Date  . No past surgeries     Family History  Problem Relation Age of Onset  . Other Neg Hx     History  Substance Use Topics  . Smoking status: Former Games developer  . Smokeless tobacco: Not on file  . Alcohol Use: No    OB History    Grav Para Term Preterm Abortions TAB SAB Ect Mult Living   1 1 1  0 0 0 0 0 0 1      Review of Systems  Constitutional: Negative for chills, activity change and appetite change.  Skin: Positive for rash. Negative for color change and wound.    Allergies  Milk-related compounds  Home Medications   Current Outpatient Rx  Name Route Sig Dispense Refill  . PRENATAL PO Oral Take 1 tablet by mouth daily.    . ALBUTEROL SULFATE HFA 108 (90 BASE) MCG/ACT IN AERS Inhalation Inhale 2 puffs into the lungs every 6 (six) hours as needed.    . SULFAMETHOXAZOLE-TRIMETHOPRIM 800-160 MG PO TABS Oral Take 1 tablet by mouth 2 (two) times daily. 14 tablet 0    BP 126/84  Pulse 77  Temp 98.2 F (36.8 C) (Oral)  Resp 16  SpO2 98%  LMP 06/16/2011  Breastfeeding? Yes  Physical Exam  Nursing note and vitals  reviewed. Constitutional: She appears well-developed and well-nourished.  Skin: Rash noted. There is erythema.       ED Course  Procedures (including critical care time)  Labs Reviewed - No data to display No results found.   1. Abscess of axilla       MDM  Patient presents urgent care where recurrent papular abruption both of her hands and feet was consistent with dermatitis        Jimmie Molly, MD 04/15/12 2125

## 2012-04-15 NOTE — ED Notes (Signed)
Pt c/o rash on hands and feet with irritation. Symptoms started a week ago. Pt states no change in soaps or detergent. Pt has not tried any treatment she is currently breast feeding.

## 2012-05-14 ENCOUNTER — Emergency Department (INDEPENDENT_AMBULATORY_CARE_PROVIDER_SITE_OTHER)
Admission: EM | Admit: 2012-05-14 | Discharge: 2012-05-14 | Disposition: A | Discharge status: Home / Self Care | Payer: Medicaid Other | Source: Home / Self Care

## 2012-05-14 ENCOUNTER — Encounter (HOSPITAL_COMMUNITY): Payer: Self-pay | Admitting: *Deleted

## 2012-05-14 DIAGNOSIS — L738 Other specified follicular disorders: Secondary | ICD-10-CM

## 2012-05-14 DIAGNOSIS — L739 Follicular disorder, unspecified: Secondary | ICD-10-CM

## 2012-05-14 DIAGNOSIS — L988 Other specified disorders of the skin and subcutaneous tissue: Secondary | ICD-10-CM

## 2012-05-14 MED ORDER — CEPHALEXIN 500 MG PO CAPS: 500.0000 mg | ORAL_CAPSULE | Freq: Three times a day (TID) | ORAL | Status: DC

## 2012-05-14 MED ORDER — KETOCONAZOLE 2 % EX SHAM: MEDICATED_SHAMPOO | CUTANEOUS | Status: DC

## 2012-05-14 NOTE — ED Notes (Signed)
PT REPORTS RASH/SCABS ON HEAD SINCE jUNE THAT have become increasingly worse and itching - cleans with peroxide , no relief

## 2012-05-14 NOTE — ED Provider Notes (Signed)
History     CSN: [id number]  Arrival date & time 05/14/12  0946   None     Chief Complaint  Patient presents with  . Rash    (Consider location/radiation/quality/duration/timing/severity/associated sxs/prior treatment) HPI Comments: 21 year old female is complaining of sores on the scalp which developed into pustules and calls desquamation of the his skin on the vertex of the scalp. She's had this condition since early September. Most of the lesions are papular and occur in between the areas in which the hair is pulled and twisted under traction to create dreadlocks. Denies systemic symptoms and there are no other lesions.  Patient is a 21 y.o. female presenting with rash.  Rash     Past Medical History  Diagnosis Date  . Asthma     Past Surgical History  Procedure Date  . No past surgeries     Family History  Problem Relation Age of Onset  . Other Neg Hx     History  Substance Use Topics  . Smoking status: Former Games developer  . Smokeless tobacco: Not on file  . Alcohol Use: No    OB History    Grav Para Term Preterm Abortions TAB SAB Ect Mult Living   1 1 1  0 0 0 0 0 0 1      Review of Systems  Constitutional: Negative.   Respiratory: Negative.   Gastrointestinal: Negative.   Genitourinary: Negative.   Musculoskeletal: Negative.   Skin: Positive for rash.    Allergies  Milk-related compounds  Home Medications   Current Outpatient Rx  Name Route Sig Dispense Refill  . ALBUTEROL SULFATE HFA 108 (90 BASE) MCG/ACT IN AERS Inhalation Inhale 2 puffs into the lungs every 6 (six) hours as needed.    . CEPHALEXIN 500 MG PO CAPS Oral Take 1 capsule (500 mg total) by mouth 3 (three) times daily. 21 capsule 0  . KETOCONAZOLE 2 % EX SHAM  Apply topically to hair 3 times a week. 120 mL ml  . PRENATAL PO Oral Take 1 tablet by mouth daily.      BP 125/78  Pulse 87  Temp 98.4 F (36.9 C) (Oral)  Resp 18  SpO2 100%  Physical Exam  Constitutional: She is  oriented to person, place, and time. She appears well-developed and well-nourished. No distress.  Eyes: EOM are normal.  Neck: Normal range of motion. Neck supple.  Pulmonary/Chest: Effort normal.  Musculoskeletal: Normal range of motion.  Neurological: She is alert and oriented to person, place, and time. She has normal reflexes. No cranial nerve deficit.  Skin: Skin is warm and dry.       [place] is papular lesions in the creases of the hair or along the vertex of the scalp. These creases are created by traction in order to form a pattern for hair cosmetics. These lesions developed into a pustule and then start draining and desquamating the surrounding skin. There is a significant amount of what appeared to be dandruff and scales.    ED Course  Procedures (including critical care time)  Labs Reviewed - No data to display No results found.   1. Folliculitis   2. Erythematosquamous dermatosis       MDM  Keflex 500 mg 3 times a day for 7 days Nizoral shampoo shampoo hair 3 times a week. Selsun Blue applications 3-4 times a week. Call the doctor list on her Medicaid card to establish PCP and followup. Stopped twisting here as the traction may be exacerbating  his condition.        Hayden Rasmussen, NP 05/14/12 [phone]

## 2012-05-15 NOTE — ED Provider Notes (Signed)
Medical screening examination/treatment/procedure(s) were performed by resident physician or non-physician practitioner and as supervising physician I was immediately available for consultation/collaboration.   Dois Juarbe DOUGLAS MD.    Stevey Stapleton D Jarvin Ogren, MD 05/15/12 1433 

## 2013-03-03 ENCOUNTER — Emergency Department (HOSPITAL_COMMUNITY)
Admission: EM | Admit: 2013-03-03 | Discharge: 2013-03-03 | Disposition: A | Discharge status: Home / Self Care | Payer: Medicaid Other | Attending: Emergency Medicine | Admitting: Emergency Medicine

## 2013-03-03 ENCOUNTER — Encounter (HOSPITAL_COMMUNITY): Payer: Self-pay | Admitting: Emergency Medicine

## 2013-03-03 ENCOUNTER — Emergency Department (HOSPITAL_COMMUNITY): Payer: Medicaid Other

## 2013-03-03 DIAGNOSIS — R059 Cough, unspecified: Secondary | ICD-10-CM | POA: Insufficient documentation

## 2013-03-03 DIAGNOSIS — Z79899 Other long term (current) drug therapy: Secondary | ICD-10-CM | POA: Insufficient documentation

## 2013-03-03 DIAGNOSIS — R05 Cough: Secondary | ICD-10-CM | POA: Insufficient documentation

## 2013-03-03 DIAGNOSIS — R0789 Other chest pain: Secondary | ICD-10-CM

## 2013-03-03 DIAGNOSIS — J45901 Unspecified asthma with (acute) exacerbation: Secondary | ICD-10-CM | POA: Insufficient documentation

## 2013-03-03 DIAGNOSIS — Z3202 Encounter for pregnancy test, result negative: Secondary | ICD-10-CM | POA: Insufficient documentation

## 2013-03-03 LAB — CBC
HCT: 38.8 % (ref 36.0–46.0)
Hemoglobin: 12.8 g/dL (ref 12.0–15.0)
MCH: 29 pg (ref 26.0–34.0)
MCHC: 33 g/dL (ref 30.0–36.0)
MCV: 88 fL (ref 78.0–100.0)

## 2013-03-03 LAB — POCT I-STAT, CHEM 8
BUN: 4 mg/dL — ABNORMAL LOW (ref 6–23)
Calcium, Ion: 1.18 mmol/L (ref 1.12–1.23)
Creatinine, Ser: 1 mg/dL (ref 0.50–1.10)
Glucose, Bld: 99 mg/dL (ref 70–99)
TCO2: 23 mmol/L (ref 0–100)

## 2013-03-03 LAB — URINALYSIS, ROUTINE W REFLEX MICROSCOPIC
Bilirubin Urine: NEGATIVE
Glucose, UA: NEGATIVE mg/dL
Ketones, ur: NEGATIVE mg/dL
Leukocytes, UA: NEGATIVE
Nitrite: NEGATIVE
Protein, ur: NEGATIVE mg/dL
Specific Gravity, Urine: >1.046 — ABNORMAL HIGH (ref 1.005–1.030)
Urobilinogen, UA: 1 mg/dL (ref 0.0–1.0)
pH: 6.5 (ref 5.0–8.0)

## 2013-03-03 LAB — URINE MICROSCOPIC-ADD ON

## 2013-03-03 LAB — D-DIMER, QUANTITATIVE: D-Dimer, Quant: 4.08 ug/mL-FEU — ABNORMAL HIGH (ref 0.00–0.48)

## 2013-03-03 LAB — PREGNANCY, URINE: Preg Test, Ur: NEGATIVE

## 2013-03-03 MED ORDER — IOHEXOL 350 MG/ML SOLN
100.0000 mL | Freq: Once | INTRAVENOUS | Status: AC | PRN
  Administered 2013-03-03: 100 mL via INTRAVENOUS

## 2013-03-03 MED ORDER — POTASSIUM CHLORIDE CRYS ER 20 MEQ PO TBCR
40.0000 meq | EXTENDED_RELEASE_TABLET | Freq: Once | ORAL | Status: AC
  Administered 2013-03-03: 40 meq via ORAL
  Filled 2013-03-03: qty 2

## 2013-03-03 MED ORDER — PREDNISONE 50 MG PO TABS: 50.0000 mg | ORAL_TABLET | Freq: Every day | ORAL | Status: DC

## 2013-03-03 NOTE — ED Notes (Signed)
Pt states she awoke @ 0230 with pain to L back, pain now in L ribs and L arm. Pt c/o SHOB, placed on 2L 02 in triage.

## 2013-03-03 NOTE — ED Provider Notes (Signed)
CSN: [id number]     Arrival date & time 03/03/13  9562 History     First MD Initiated Contact with Patient 03/03/13 [phone]     Chief Complaint  Patient presents with  . Chest Pain   (Consider location/radiation/quality/duration/timing/severity/associated sxs/prior Treatment) HPI Comments: Pt presents to the ED with left sided chest pain and SOB since 2:00am this morning, pain woke her from sleep. Pain described as sharp and stabbing with radiation to the L shoulder, worse with inspiration. States that pain has improved since it began. She has never had pain like this before. Pt has hx of asthma, used albuterol inhaler this morning for SOB without relief. Denies palpitations, weakness/dizziness, blurry vision, nausea/vomiting, swelling of extremities, headache, or hemoptysis. No recent trauma or surgeries. Smokes hookah occasionally, no cigarette use. No EtOH use. Denies   Patient is a 22 y.o. female presenting with chest pain.  Chest Pain   Past Medical History  Diagnosis Date  . Asthma    Past Surgical History  Procedure Laterality Date  . No past surgeries     Family History  Problem Relation Age of Onset  . Other Neg Hx    History  Substance Use Topics  . Smoking status: Never Smoker   . Smokeless tobacco: Not on file  . Alcohol Use: Yes     Comment: occ   OB History   Grav Para Term Preterm Abortions TAB SAB Ect Mult Living   1 1 1  0 0 0 0 0 0 1     Review of Systems  Cardiovascular: Positive for chest pain.   All other systems negative except as documented in the HPI. All pertinent positives and negatives as reviewed in the HPI. Allergies  Milk-related compounds  Home Medications   Current Outpatient Rx  Name  Route  Sig  Dispense  Refill  . albuterol (PROVENTIL HFA;VENTOLIN HFA) 108 (90 BASE) MCG/ACT inhaler   Inhalation   Inhale 2 puffs into the lungs every 6 (six) hours as needed.         . sulfamethoxazole-trimethoprim (BACTRIM DS) 800-160 MG per  tablet   Oral   Take 1 tablet by mouth 2 (two) times daily.          BP 108/64  Pulse 89  Temp(Src) 98.9 F (37.2 C) (Oral)  Resp 14  Ht 5\' 3"  (1.6 m)  Wt 145 lb (65.772 kg)  BMI 25.69 kg/m2  SpO2 99%  LMP 02/11/2013 Physical Exam  Nursing note and vitals reviewed. Constitutional: She is oriented to person, place, and time. She appears well-developed and well-nourished. No distress.  HENT:  Head: Normocephalic and atraumatic.  Mouth/Throat: Oropharynx is clear and moist.  Eyes: Pupils are equal, round, and reactive to light.  Neck: Normal range of motion. Neck supple.  Cardiovascular: Normal rate, regular rhythm and normal heart sounds.  Exam reveals no gallop and no friction rub.   No murmur heard. Pulmonary/Chest: Effort normal and breath sounds normal. No respiratory distress. She has no wheezes.  Abdominal: Soft. Bowel sounds are normal. She exhibits no distension.  Neurological: She is alert and oriented to person, place, and time. She exhibits normal muscle tone. Coordination normal.  Skin: Skin is warm and dry. No rash noted.    ED Course   Procedures (including critical care time)  Labs Reviewed  D-DIMER, QUANTITATIVE - Abnormal; Notable for the following:    D-Dimer, Quant 4.08 (*)    All other components within normal limits  POCT I-STAT, CHEM  8 - Abnormal; Notable for the following:    Potassium 3.2 (*)    BUN 4 (*)    All other components within normal limits  CBC  URINALYSIS, ROUTINE W REFLEX MICROSCOPIC  PREGNANCY, URINE   Dg Chest 2 View  03/03/2013   *RADIOLOGY REPORT*  Clinical Data: Chest pain and shortness of breath.  CHEST - 2 VIEW  Comparison: No priors.  Findings: Lung volumes are normal.  No consolidative airspace disease.  No pleural effusions.  No pneumothorax.  No pulmonary nodule or mass noted.  Pulmonary vasculature and the cardiomediastinal silhouette are within normal limits.  IMPRESSION: 1. No radiographic evidence of acute  cardiopulmonary disease.   Original Report Authenticated By: Trudie Reed, M.D.   Ct Angio Chest Pe W/cm &/or Wo Cm  03/03/2013   *RADIOLOGY REPORT*  Clinical Data: Elevated D-dimer and shortness of breath.  Acute onset left back pain.  CT ANGIOGRAPHY CHEST  Technique:  Multidetector CT imaging of the chest using the standard protocol during bolus administration of intravenous contrast. Multiplanar reconstructed images including MIPs were obtained and reviewed to evaluate the vascular anatomy.  Contrast: OMNIPAQUE IOHEXOL 350 MG/ML SOLN  Comparison: Chest radiograph 03/03/2013.  Findings: No pulmonary embolus.  Thymus is seen in the anterior mediastinum.  No pathologically enlarged mediastinal, hilar or axillary lymph nodes.  Heart size normal.  No pericardial effusion. Distal esophagus is mildly dilated and the wall appears slightly thickened.  Lungs are clear.  No pleural fluid.  Airway is unremarkable.  Incidental imaging of the upper abdomen shows no acute findings. No worrisome lytic or sclerotic lesions.  IMPRESSION:  1.  Negative for pulmonary embolus. 2.  Mild distal esophageal dilatation and probable wall thickening. Findings can be seen with gastroesophageal reflux disease.   Original Report Authenticated By: Leanna Battles, M.D.   Patient is feeling better.  Here in the emergency department and was concerned for pulmonary embolus initially, but the CT of her chest was negative.  Patient's vital signs have normalized.  Patient has had some cough with her asthma history should be placed on prednisone.  Patient is advised followup with her primary care Dr. and referred to the [facility].  Told to return here as needed MDM    Carlyle Dolly, PA-C 03/03/13 1533

## 2013-03-04 NOTE — ED Provider Notes (Signed)
Medical screening examination/treatment/procedure(s) were performed by non-physician practitioner and as supervising physician I was immediately available for consultation/collaboration.  Raeford Razor, MD 03/04/13 [phone]

## 2013-06-19 IMAGING — US US OB TRANSVAGINAL
1 series · 14 of 28 positions shown · non-contrast
Comparison: None.

CLINICAL DATA: Pain and bleeding for 1 day, pregnant; quantitative
beta HCG 105,157

OBSTETRIC <14 WK US AND TRANSVAGINAL OB US
TECHNIQUE: Both transabdominal and transvaginal ultrasound
examinations were performed for complete evaluation of the
gestation as well as the maternal uterus, adnexal regions, and
pelvic cul-de-sac.  Transvaginal technique was performed to assess
early pregnancy.

[Series 1: us ob transvaginal · 0.18mm/px · 60 acquisitions, 14 frames shown]
[im 3/60]
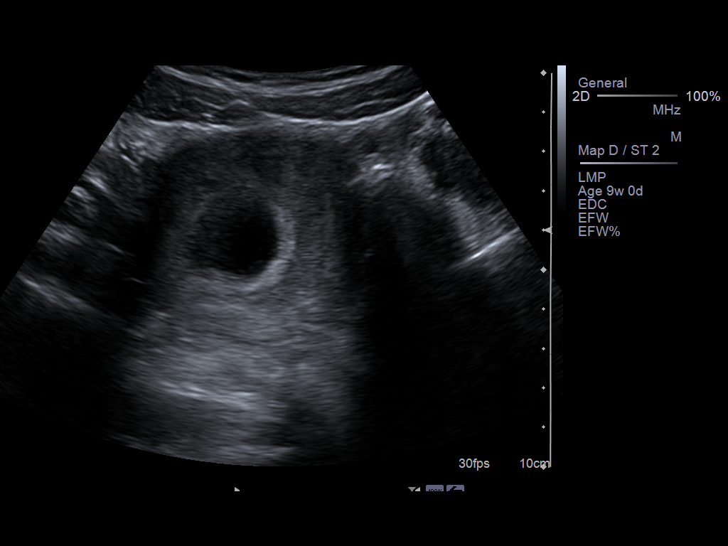
[im 7/60]
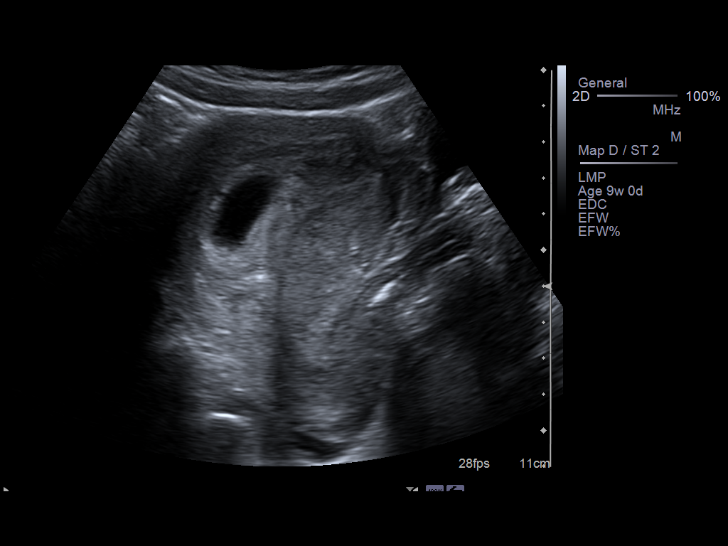
[im 11/60]
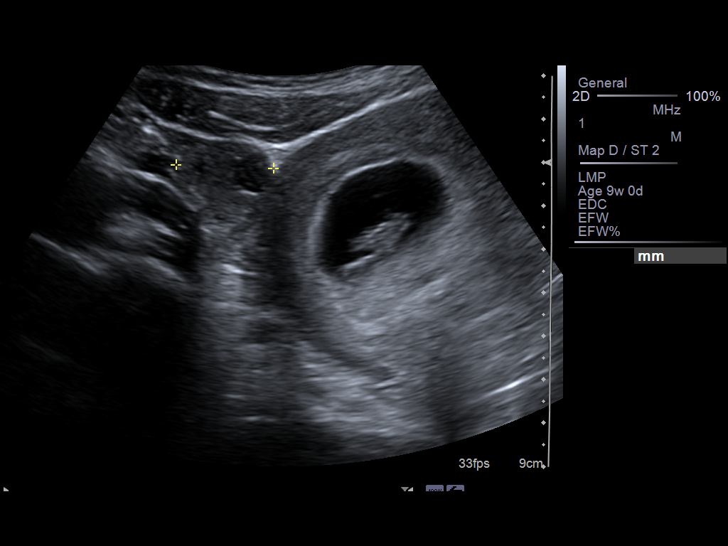
[im 16/60]
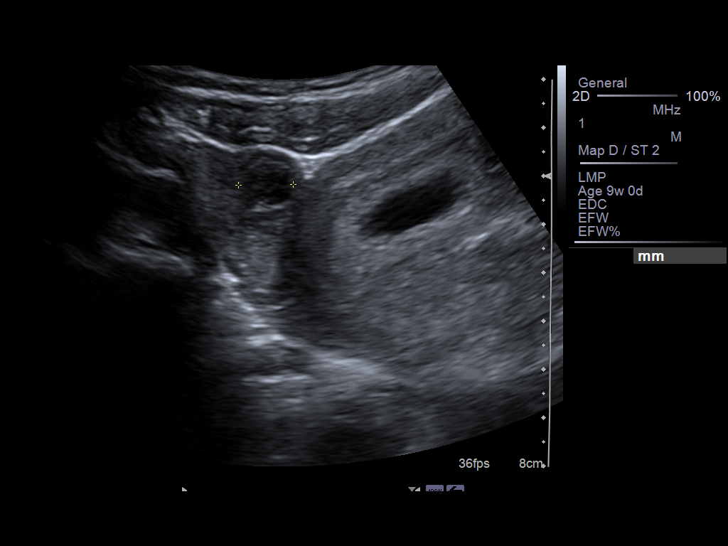
[im 20/60]
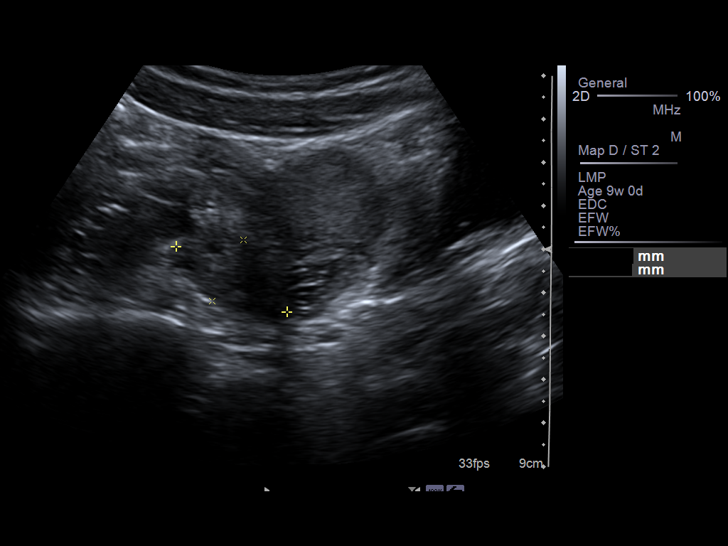
[im 25/60]
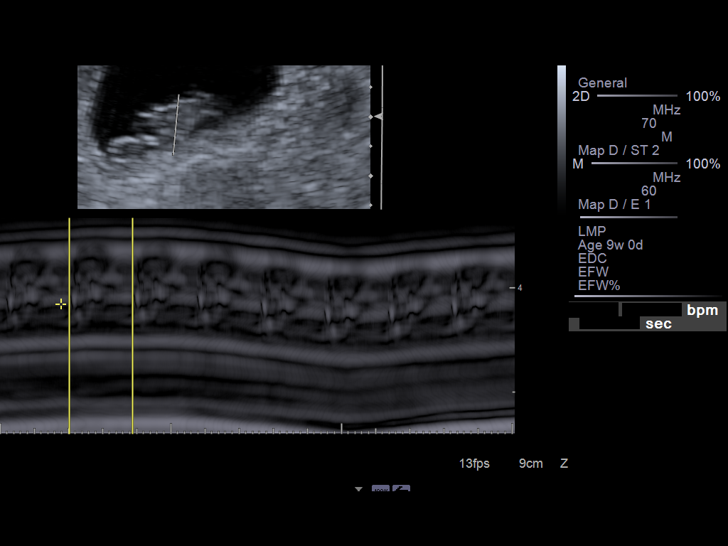
[im 29/60]
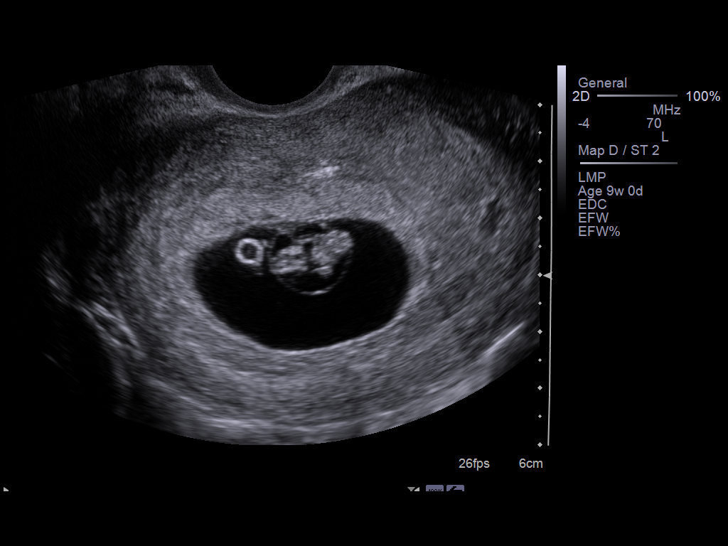
[im 33/60]
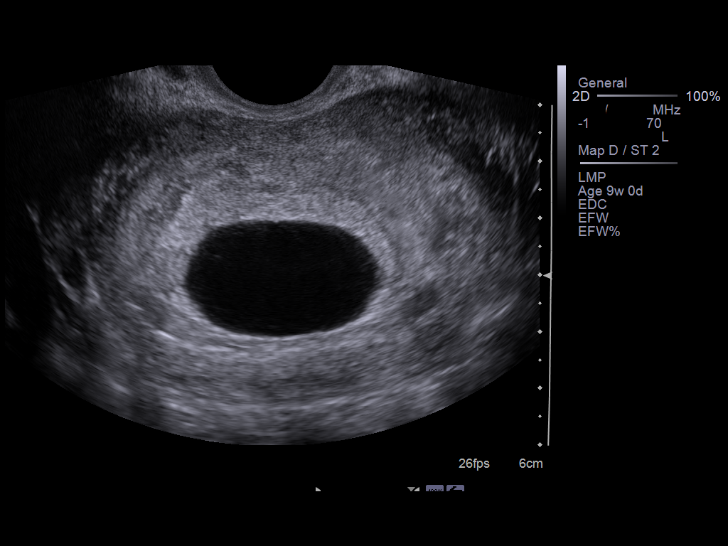
[im 38/60]
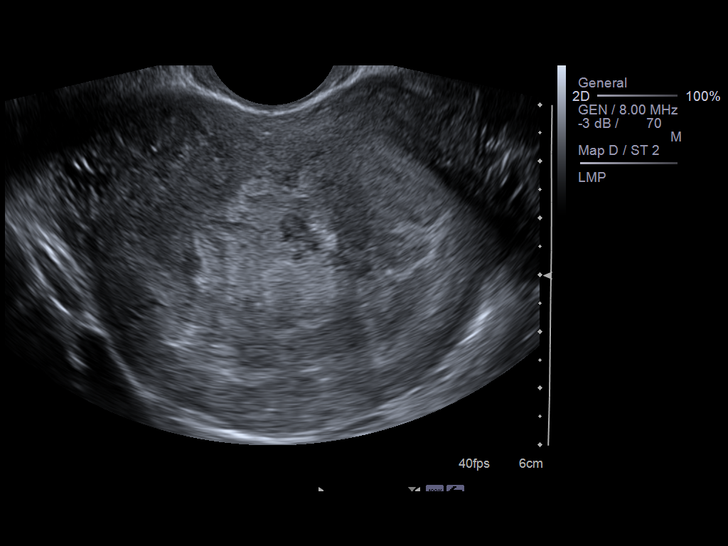
[im 42/60]
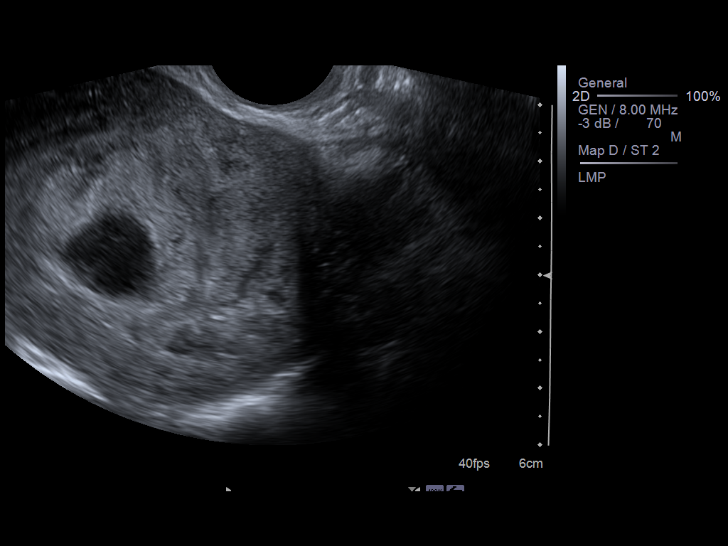
[im 46/60]
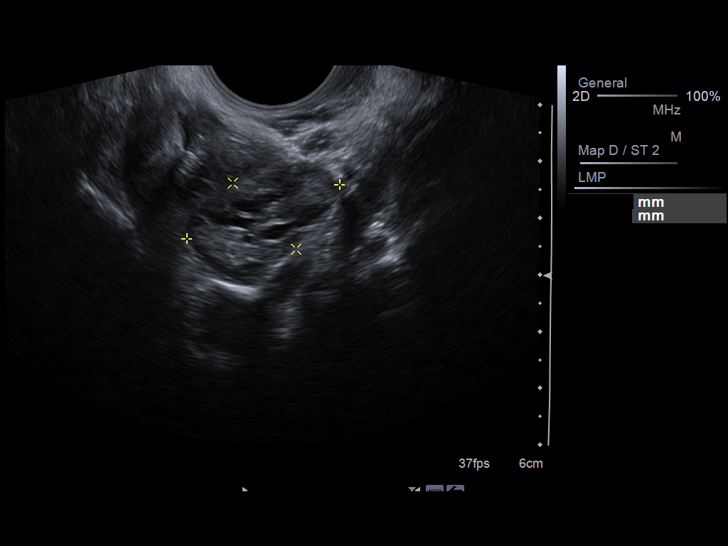
[im 51/60]
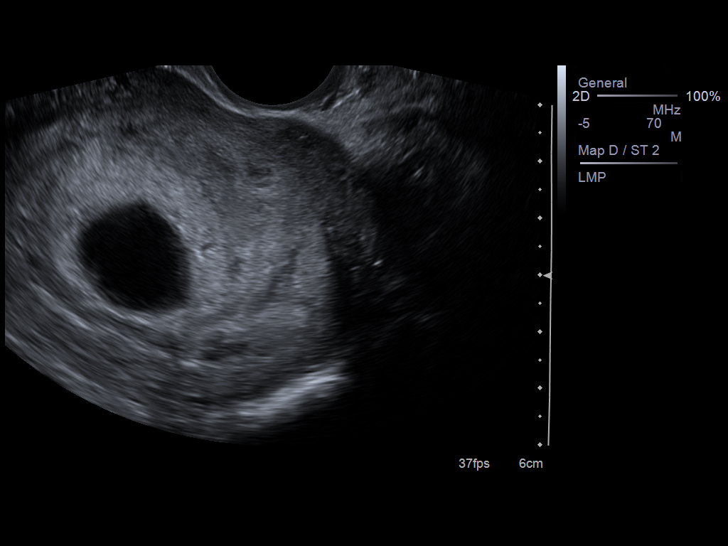
[im 55/60]
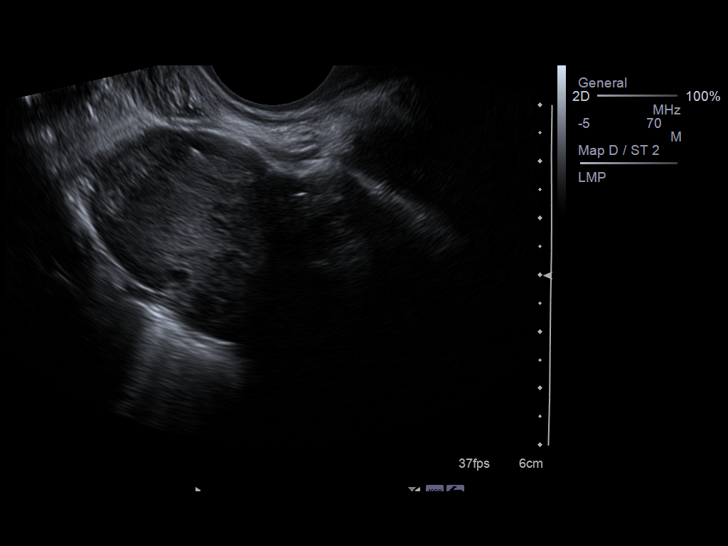
[im 60/60]
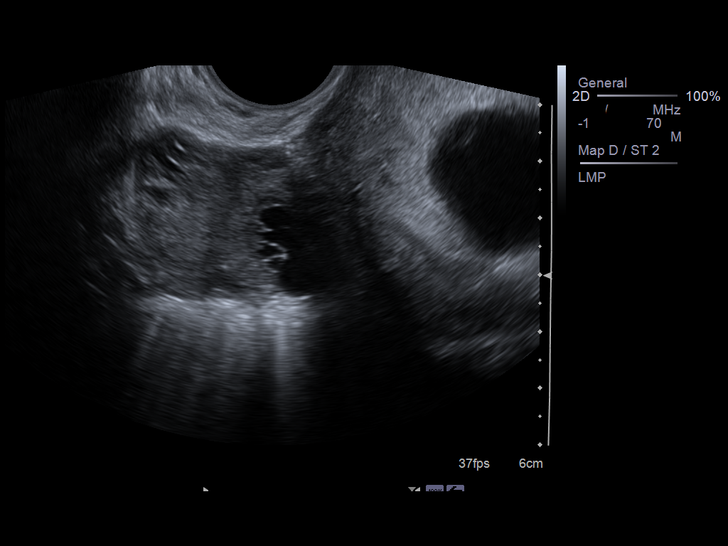

[14 of 28 positions shown; findings below may reference images not displayed]

Intrauterine gestational sac:  Visualized/normal in shape.
Yolk sac: Present
Embryo: Present
Cardiac Activity: Present
Heart Rate: 162 bpm

CRL: 15.6 mm     8 w  0 d           US EDC: 03/29/2012

Maternal uterus/adnexae:
Small subchorionic hemorrhage.
Right ovary measures 3.4 x 3.9 x 3.1 cm and contains a small
complicated nodule 1.6 x 1.2 x 1.4 cm, question hemorrhagic corpus
luteal cyst.
Left ovary normal size and morphology, 2.0 x 1.6 x 3.0 cm.
Small amount of nonspecific free pelvic fluid.
IMPRESSION: Single live early intrauterine gestation measured at 8 weeks 0 days
EGA.
Small subchorionic hemorrhage.

## 2014-01-31 IMAGING — US US FETAL BPP W/O NONSTRESS
1 series · 11 of 11 positions shown · non-contrast
Comparison: none

CLINICAL DATA: Nonreactive nonstress test.  Post dates.
Gestational age is 40 weeks 2 days.

[Series 1: us fetal bpp w/o nonstress · non-contrast · 11 acquisitions, 11 frames shown]
[im 1/11]
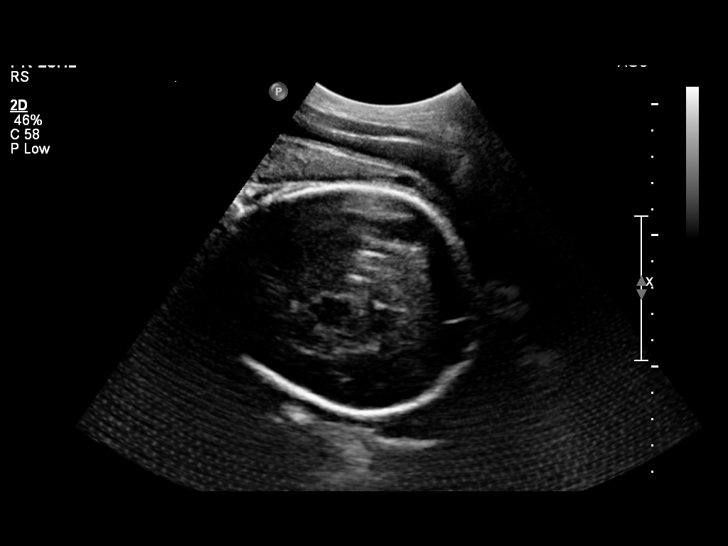
[im 2/11]
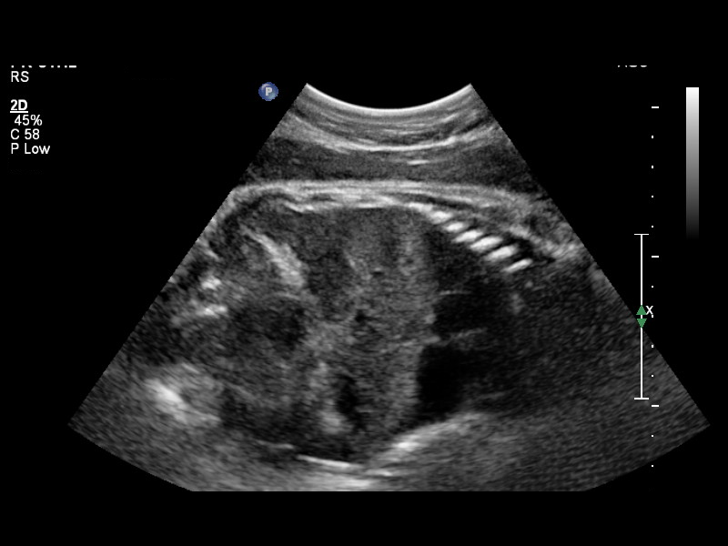
[im 3/11]
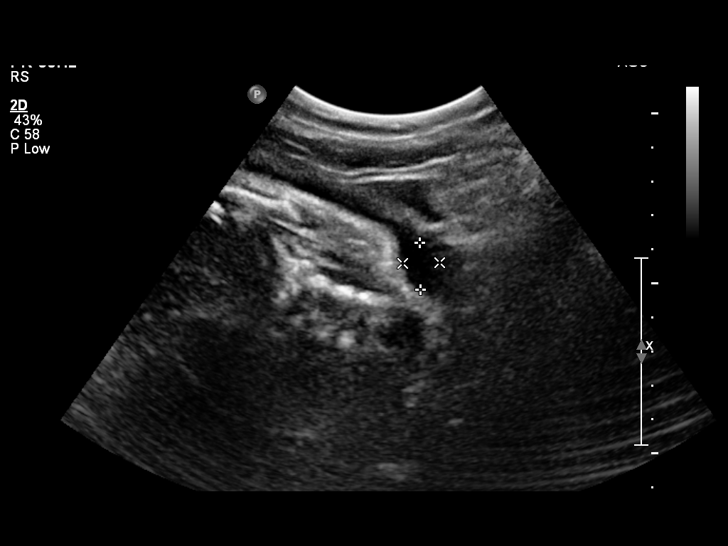
[im 4/11]
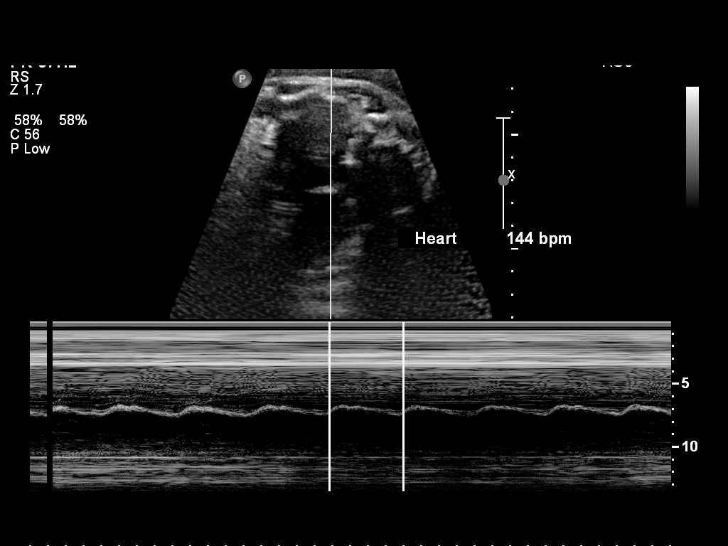
[im 5/11]
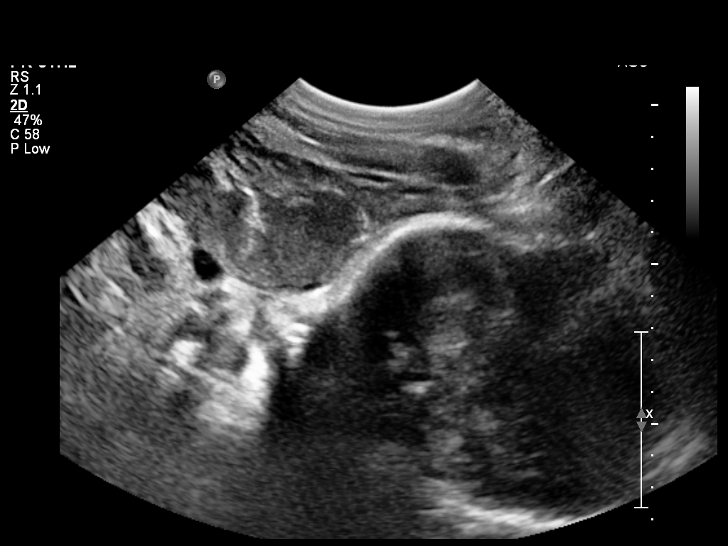
[im 6/11]
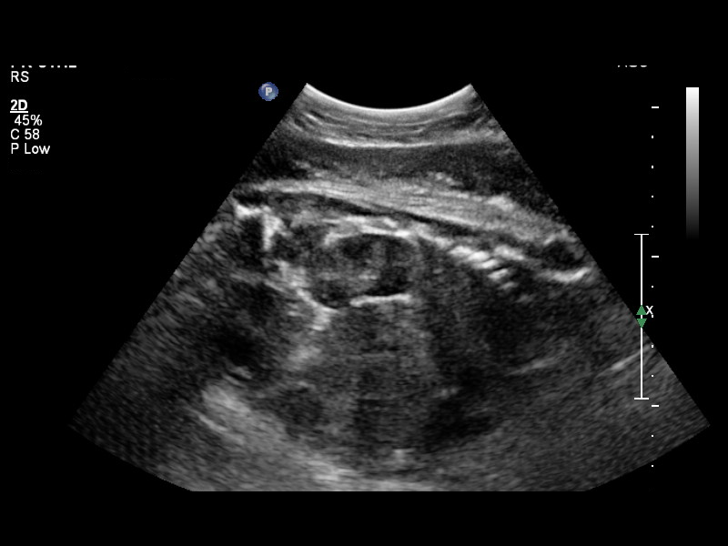
[im 7/11]
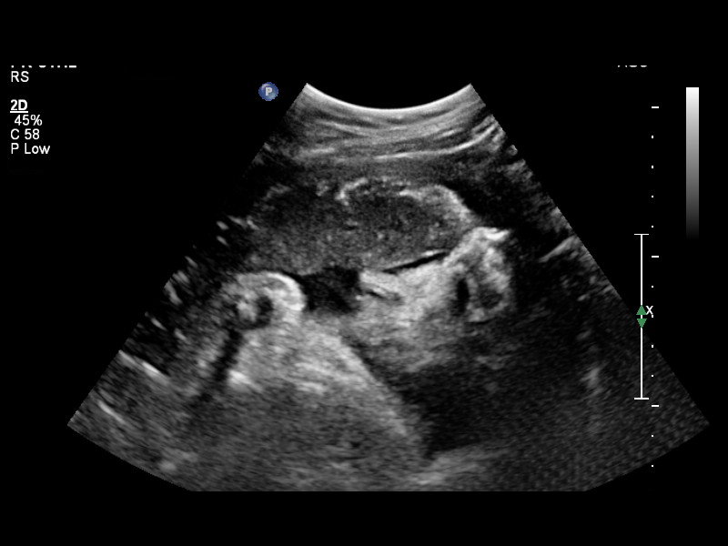
[im 8/11]
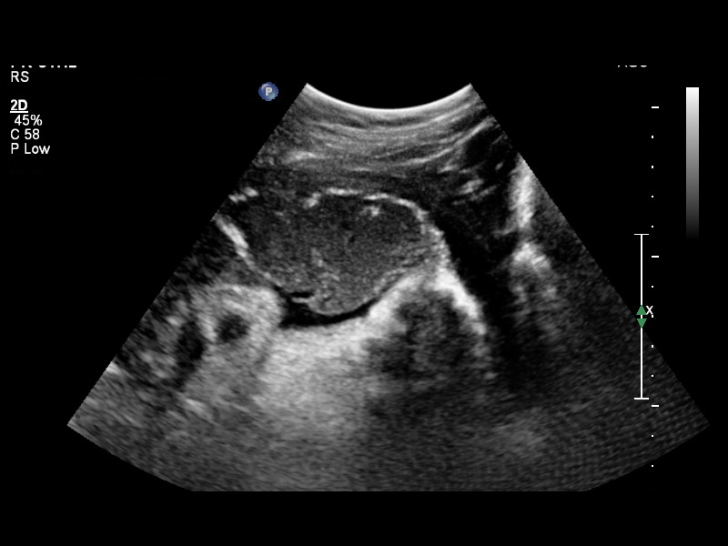
[im 9/11]
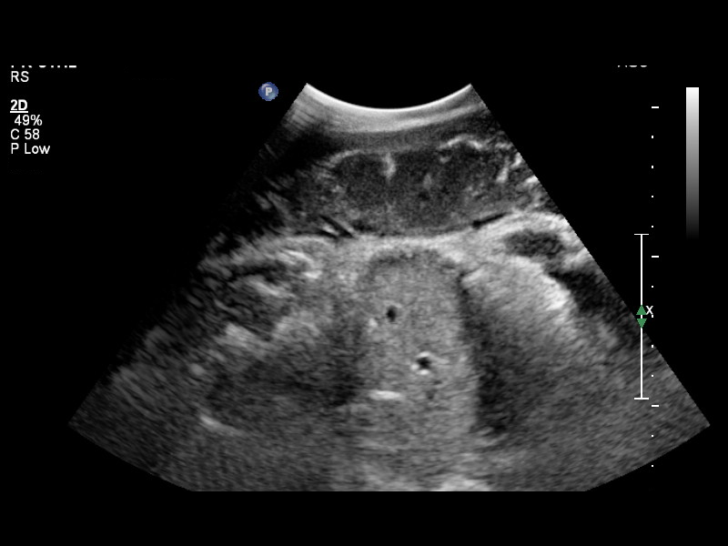
[im 10/11]
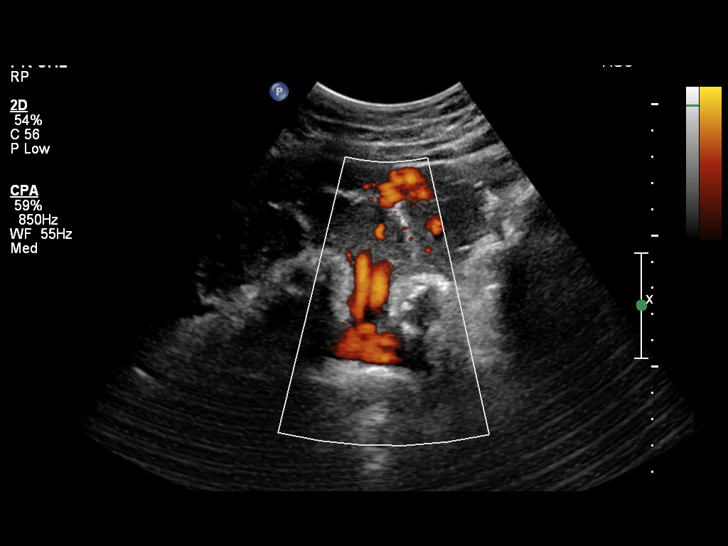
[im 11/11]
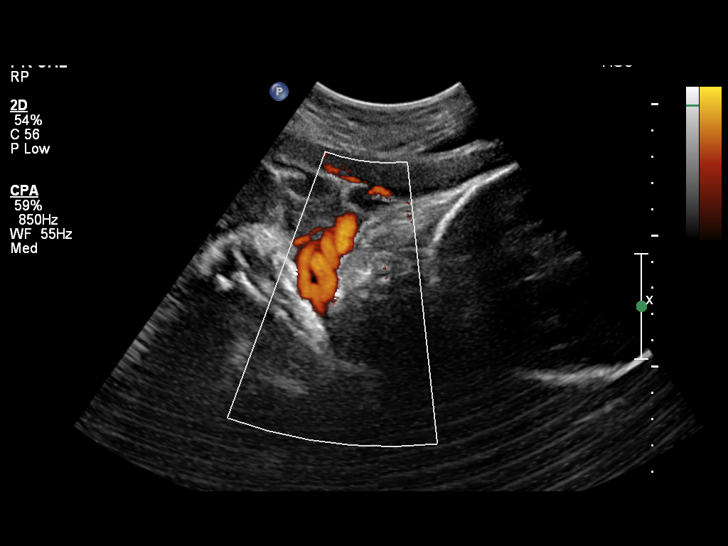

[11 of 11 positions shown; findings below may reference images not displayed]

LIMITED OBSTETRIC ULTRASOUND for biophysical profile

Number of Fetuses: 1
Heart Rate: 144 bpm
Movement: Fetal movement is demonstrated.
Presentation: The fetus is in cephalic presentation.
Placental Location: Placenta is anterior.
Previa: Limited visualization due to gestational age.
Amniotic Fluid (Subjective): Decreased

Vertical pocket:  1.37cm

Biophysical profile is obtained with imaging over 18 minutes.
Biophysical profile is [DATE] with positive scores for fetal movement,
fetal breathing, and fetal tone.  Amniotic fluid volume is
decreased.
IMPRESSION: Biophysical profile is [DATE] due to decreased amniotic fluid.

Recommend followup with non-emergent complete OB 14+ wk US
examination for fetal biometric evaluation and anatomic survey if
not already performed.

## 2014-03-24 ENCOUNTER — Encounter (HOSPITAL_COMMUNITY): Payer: Self-pay | Admitting: Emergency Medicine

## 2014-03-24 ENCOUNTER — Emergency Department (HOSPITAL_COMMUNITY)
Admission: EM | Admit: 2014-03-24 | Discharge: 2014-03-25 | Discharge status: Against Medical Advice | Payer: Medicaid Other | Attending: Emergency Medicine | Admitting: Emergency Medicine

## 2014-03-24 DIAGNOSIS — F17200 Nicotine dependence, unspecified, uncomplicated: Secondary | ICD-10-CM | POA: Insufficient documentation

## 2014-03-24 DIAGNOSIS — N898 Other specified noninflammatory disorders of vagina: Secondary | ICD-10-CM | POA: Insufficient documentation

## 2014-03-24 DIAGNOSIS — R109 Unspecified abdominal pain: Secondary | ICD-10-CM | POA: Diagnosis not present

## 2014-03-24 DIAGNOSIS — J45909 Unspecified asthma, uncomplicated: Secondary | ICD-10-CM | POA: Diagnosis not present

## 2014-03-24 NOTE — ED Notes (Addendum)
Pt c/o vaginal bleeding x 2-3 weeks. Average 2 pph. Intermittent abd pain, denies n/v/d.  Pt unable to get into GYN

## 2014-03-25 NOTE — ED Notes (Signed)
Unable to locate pt in [facility].

## 2014-04-15 ENCOUNTER — Emergency Department (HOSPITAL_COMMUNITY)
Admission: EM | Admit: 2014-04-15 | Discharge: 2014-04-15 | Disposition: A | Discharge status: Home / Self Care | Payer: Medicaid Other | Attending: Emergency Medicine | Admitting: Emergency Medicine

## 2014-04-15 ENCOUNTER — Encounter (HOSPITAL_COMMUNITY): Payer: Self-pay | Admitting: Emergency Medicine

## 2014-04-15 DIAGNOSIS — L678 Other hair color and hair shaft abnormalities: Secondary | ICD-10-CM | POA: Insufficient documentation

## 2014-04-15 DIAGNOSIS — B350 Tinea barbae and tinea capitis: Secondary | ICD-10-CM | POA: Diagnosis not present

## 2014-04-15 DIAGNOSIS — F17200 Nicotine dependence, unspecified, uncomplicated: Secondary | ICD-10-CM | POA: Diagnosis not present

## 2014-04-15 DIAGNOSIS — L738 Other specified follicular disorders: Secondary | ICD-10-CM | POA: Insufficient documentation

## 2014-04-15 DIAGNOSIS — Z79899 Other long term (current) drug therapy: Secondary | ICD-10-CM | POA: Insufficient documentation

## 2014-04-15 DIAGNOSIS — L739 Follicular disorder, unspecified: Secondary | ICD-10-CM

## 2014-04-15 DIAGNOSIS — J45909 Unspecified asthma, uncomplicated: Secondary | ICD-10-CM | POA: Insufficient documentation

## 2014-04-15 DIAGNOSIS — IMO0002 Reserved for concepts with insufficient information to code with codable children: Secondary | ICD-10-CM | POA: Insufficient documentation

## 2014-04-15 DIAGNOSIS — R21 Rash and other nonspecific skin eruption: Secondary | ICD-10-CM | POA: Insufficient documentation

## 2014-04-15 MED ORDER — SULFAMETHOXAZOLE-TRIMETHOPRIM 800-160 MG PO TABS: 1.0000 | ORAL_TABLET | Freq: Two times a day (BID) | ORAL | Status: DC

## 2014-04-15 MED ORDER — GRISEOFULVIN MICROSIZE 500 MG PO TABS: 500.0000 mg | ORAL_TABLET | Freq: Every day | ORAL | Status: DC

## 2014-04-15 NOTE — ED Notes (Signed)
Pt states she has had these symptoms for two years. Pt reports have an increase in episodes of bleeding, sores, pus and decided to come to the ED. Pt suspects she has MRSA and states "I cannot stand it any longer". Pt denies injury to scalp and denies using any irritating products. Pt is calm and family is present at this time.

## 2014-04-15 NOTE — ED Provider Notes (Signed)
CSN: [id number]     Arrival date & time 04/15/14  1304 History   First MD Initiated Contact with Patient 04/15/14 1322     Chief Complaint  Patient presents with  . Hair/Scalp Problem    Itching, bleeding, pus filled drainage      (Consider location/radiation/quality/duration/timing/severity/associated sxs/prior Treatment) HPI Comments: Patient presents to the ER for evaluation of a scalp problem. Patient reports that she has had an ongoing issue with her scalp for 2 years. She has seen a dermatologist multiple times. She reports that she was put on some kind of medication for a month but it did not help. She has scheduled follow up next month, but symptoms are worsening and she "cannot stand it any longer".  This report is her loss, pain, drainage, pus coming from the scalp in areas.   Past Medical History  Diagnosis Date  . Asthma    Past Surgical History  Procedure Laterality Date  . No past surgeries     Family History  Problem Relation Age of Onset  . Other Neg Hx    History  Substance Use Topics  . Smoking status: Current Some Day Smoker  . Smokeless tobacco: Not on file  . Alcohol Use: Yes     Comment: occ   OB History   Grav Para Term Preterm Abortions TAB SAB Ect Mult Living   0 0 0 0 0 0 1     Review of Systems  Skin: Positive for rash and wound.      Allergies  Milk-related compounds  Home Medications   Prior to Admission medications   Medication Sig Start Date End Date Taking? Authorizing Provider  beclomethasone (QVAR) 80 MCG/ACT inhaler Inhale 2 puffs into the lungs every 4 (four) hours.    Historical Provider, MD  drospirenone-ethinyl estradiol (YAZ,GIANVI,LORYNA) 3-0.02 MG tablet Take 1 tablet by mouth daily.    Historical Provider, MD   BP 131/82  Pulse 91  Temp(Src) 98.3 F (36.8 C) (Oral)  Resp 16  Ht  (1.6 m)  Wt 150 lb (68.04 kg)  BMI 26.58 kg/m2  SpO2 100%  LMP 03/03/2014 Physical Exam  Constitutional: She appears  well-developed and well-nourished.  HENT:  Head: Normocephalic.  Skin: Rash noted.       ED Course  Procedures (including critical care time) Labs Review Labs Reviewed - No data to display  Imaging Review No results found.   EKG Interpretation None      MDM   Final diagnoses:  None   scalp infection, to get capitis versus folliculitis  Patient presents for evaluation of scalp lesions. These symptoms have been ongoing for 2 years. She reports progressive worsening. Examination reveals significant thickening, drainage. This might be secondary to chronic bacterial infection, such as MRSA. Cannot, however, rule out Tinea Capitis with Kerion formation. Patient will require treatment for both. Will treat with Bactrim and griseofulvin. Patient was counseled that she does need to follow up with her dermatologist. She does not respond to treatment, would require biopsy for further management.    Gilda Crease, MD 04/15/14 1344

## 2014-04-15 NOTE — Discharge Instructions (Signed)
Folliculitis  Folliculitis is redness, soreness, and swelling (inflammation) of the hair follicles. This condition can occur anywhere on the body. People with weakened immune systems, diabetes, or obesity have a greater risk of getting folliculitis. CAUSES  Bacterial infection. This is the most common cause.  Fungal infection.  Viral infection.  Contact with certain chemicals, especially oils and tars. Long-term folliculitis can result from bacteria that live in the nostrils. The bacteria may trigger multiple outbreaks of folliculitis over time. SYMPTOMS Folliculitis most commonly occurs on the scalp, thighs, legs, back, buttocks, and areas where hair is shaved frequently. An early sign of folliculitis is a small, white or yellow, pus-filled, itchy lesion (pustule). These lesions appear on a red, inflamed follicle. They are usually less than 0.2 inches (5 mm) wide. When there is an infection of the follicle that goes deeper, it becomes a boil or furuncle. A group of closely packed boils creates a larger lesion (carbuncle). Carbuncles tend to occur in hairy, sweaty areas of the body. DIAGNOSIS  Your caregiver can usually tell what is wrong by doing a physical exam. A sample may be taken from one of the lesions and tested in a lab. This can help determine what is causing your folliculitis. TREATMENT  Treatment may include:  Applying warm compresses to the affected areas.  Taking antibiotic medicines orally or applying them to the skin.  Draining the lesions if they contain a large amount of pus or fluid.  Laser hair removal for cases of long-lasting folliculitis. This helps to prevent regrowth of the hair. HOME CARE INSTRUCTIONS  Apply warm compresses to the affected areas as directed by your caregiver.  If antibiotics are prescribed, take them as directed. Finish them even if you start to feel better.  You may take over-the-counter medicines to relieve itching.  Do not shave  irritated skin.  Follow up with your caregiver as directed. SEEK IMMEDIATE MEDICAL CARE IF:   You have increasing redness, swelling, or pain in the affected area.  You have a fever. MAKE SURE YOU:  Understand these instructions.  Will watch your condition.  Will get help right away if you are not doing well or get worse. Document Released: 09/15/2001 Document Revised: 01/06/2012 Document Reviewed: 10/07/2011 [facility] Patient Information 2015 [place], [place]. This information is not intended to replace advice given to you by your health care provider. Make sure you discuss any questions you have with your health care provider.  Ringworm of the Scalp Tinea Capitis is also called scalp ringworm. It is a fungal infection of the skin on the scalp seen mainly in children.  CAUSES  Scalp ringworm spreads from:  Other people.  Pets (cats and dogs) and animals.  Bedding, hats, combs or brushes shared with an infected person  Theater seats that an infected person sat in. SYMPTOMS  Scalp ringworm causes the following symptoms:  Flaky scales that look like dandruff.  Circles of thick, raised red skin.  Hair loss.  Red pimples or pustules.  Swollen glands in the back of the neck.  Itching. DIAGNOSIS  A skin scraping or infected hairs will be sent to test for fungus. Testing can be done either by looking under the microscope (KOH examination) or by doing a culture (test to try to grow the fungus). A culture can take up to 2 weeks to come back. TREATMENT   Scalp ringworm must be treated with medicine by mouth to kill the fungus for 6 to 8 weeks.  Medicated shampoos (ketoconazole or  selenium sulfide shampoo) may be used to decrease the shedding of fungal spores from the scalp.  Steroid medicines are used for severe cases that are very inflamed in conjunction with antifungal medication.  It is important that any family members or pets that have the fungus be treated. HOME CARE  INSTRUCTIONS   Be sure to treat the rash completely - follow your caregiver's instructions. It can take a month or more to treat. If you do not treat it long enough, the rash can come back.  Watch for other cases in your family or pets.  Do not share brushes, combs, barrettes, or hats. Do not share towels.  Combs, brushes, and hats should be cleaned carefully and natural bristle brushes must be thrown away.  It is not necessary to shave the scalp or wear a hat during treatment.  Children may attend school once they start treatment with the oral medicine.  Be sure to follow up with your caregiver as directed to be sure the infection is gone. SEEK MEDICAL CARE IF:   Rash is worse.  Rash is spreading.  Rash returns after treatment is completed.  The rash is not better in 2 weeks with treatment. Fungal infections are slow to respond to treatment. Some redness may remain for several weeks after the fungus is gone. SEEK IMMEDIATE MEDICAL CARE IF:  The area becomes red, warm, tender, and swollen.  Pus is oozing from the rash.  You or your child has an oral temperature above 102 F (38.9 C), not controlled by medicine. Document Released: 07/04/2000 Document Revised: 09/29/2011 Document Reviewed: 08/16/2008 [facility] Patient Information 2015 [place], [place]. This information is not intended to replace advice given to you by your health care provider. Make sure you discuss any questions you have with your health care provider.

## 2014-05-22 ENCOUNTER — Encounter (HOSPITAL_COMMUNITY): Payer: Self-pay | Admitting: Emergency Medicine

## 2017-07-14 ENCOUNTER — Encounter (HOSPITAL_COMMUNITY): Payer: Self-pay | Admitting: Emergency Medicine

## 2017-07-14 ENCOUNTER — Other Ambulatory Visit: Payer: Self-pay

## 2017-07-14 DIAGNOSIS — Z87891 Personal history of nicotine dependence: Secondary | ICD-10-CM | POA: Insufficient documentation

## 2017-07-14 DIAGNOSIS — R202 Paresthesia of skin: Secondary | ICD-10-CM | POA: Insufficient documentation

## 2017-07-14 DIAGNOSIS — Z79899 Other long term (current) drug therapy: Secondary | ICD-10-CM | POA: Diagnosis not present

## 2017-07-14 DIAGNOSIS — R112 Nausea with vomiting, unspecified: Secondary | ICD-10-CM | POA: Insufficient documentation

## 2017-07-14 DIAGNOSIS — M549 Dorsalgia, unspecified: Secondary | ICD-10-CM | POA: Diagnosis not present

## 2017-07-14 DIAGNOSIS — J4521 Mild intermittent asthma with (acute) exacerbation: Secondary | ICD-10-CM | POA: Insufficient documentation

## 2017-07-14 LAB — CBC
HEMATOCRIT: 39.2 % (ref 36.0–46.0)
HEMOGLOBIN: 13 g/dL (ref 12.0–15.0)
MCH: 29.7 pg (ref 26.0–34.0)
MCHC: 33.2 g/dL (ref 30.0–36.0)
MCV: 89.7 fL (ref 78.0–100.0)
Platelets: 266 10*3/uL (ref 150–400)
RBC: 4.37 MIL/uL (ref 3.87–5.11)
RDW: 12.7 % (ref 11.5–15.5)
WBC: 6.3 10*3/uL (ref 4.0–10.5)

## 2017-07-14 LAB — URINALYSIS, ROUTINE W REFLEX MICROSCOPIC
BACTERIA UA: NONE SEEN
BILIRUBIN URINE: NEGATIVE
Glucose, UA: NEGATIVE mg/dL
Ketones, ur: NEGATIVE mg/dL
Leukocytes, UA: NEGATIVE
NITRITE: NEGATIVE
Protein, ur: NEGATIVE mg/dL
SPECIFIC GRAVITY, URINE: 1.014 (ref 1.005–1.030)
pH: 7 (ref 5.0–8.0)

## 2017-07-14 LAB — I-STAT BETA HCG BLOOD, ED (MC, WL, AP ONLY)

## 2017-07-14 MED ORDER — ONDANSETRON 4 MG PO TBDP
4.0000 mg | ORAL_TABLET | Freq: Once | ORAL | Status: AC | PRN
  Administered 2017-07-14: 4 mg via ORAL
  Filled 2017-07-14: qty 1

## 2017-07-14 NOTE — ED Triage Notes (Signed)
Reports having n/v for a week.  States my asthma is acting up as well.  Out of inhaler.  Breathing easy and nonlabored in triage.

## 2017-07-15 ENCOUNTER — Emergency Department (HOSPITAL_COMMUNITY)
Admission: EM | Admit: 2017-07-15 | Discharge: 2017-07-15 | Disposition: A | Discharge status: Home / Self Care | Payer: BLUE CROSS/BLUE SHIELD | Attending: Emergency Medicine | Admitting: Emergency Medicine

## 2017-07-15 DIAGNOSIS — J4521 Mild intermittent asthma with (acute) exacerbation: Secondary | ICD-10-CM

## 2017-07-15 DIAGNOSIS — R202 Paresthesia of skin: Secondary | ICD-10-CM

## 2017-07-15 DIAGNOSIS — R112 Nausea with vomiting, unspecified: Secondary | ICD-10-CM

## 2017-07-15 LAB — COMPREHENSIVE METABOLIC PANEL
ALBUMIN: 3.7 g/dL (ref 3.5–5.0)
ALT: 10 U/L — ABNORMAL LOW (ref 14–54)
ANION GAP: 7 (ref 5–15)
AST: 15 U/L (ref 15–41)
Alkaline Phosphatase: 57 U/L (ref 38–126)
BUN: 9 mg/dL (ref 6–20)
CHLORIDE: 104 mmol/L (ref 101–111)
CO2: 25 mmol/L (ref 22–32)
Calcium: 9.1 mg/dL (ref 8.9–10.3)
Creatinine, Ser: 0.94 mg/dL (ref 0.44–1.00)
GFR calc Af Amer: >60 mL/min (ref >60)
GFR calc non Af Amer: >60 mL/min (ref >60)
GLUCOSE: 94 mg/dL (ref 65–99)
POTASSIUM: 3.2 mmol/L — AB (ref 3.5–5.1)
SODIUM: 136 mmol/L (ref 135–145)
TOTAL PROTEIN: 6.9 g/dL (ref 6.5–8.1)
Total Bilirubin: 0.4 mg/dL (ref 0.3–1.2)

## 2017-07-15 LAB — LIPASE, BLOOD: Lipase: 40 U/L (ref 11–51)

## 2017-07-15 MED ORDER — PREDNISONE 20 MG PO TABS: 40.0000 mg | ORAL_TABLET | Freq: Every day | ORAL | 0 refills | Status: DC

## 2017-07-15 MED ORDER — POTASSIUM CHLORIDE CRYS ER 20 MEQ PO TBCR
40.0000 meq | EXTENDED_RELEASE_TABLET | Freq: Once | ORAL | Status: AC
  Administered 2017-07-15: 40 meq via ORAL
  Filled 2017-07-15: qty 2

## 2017-07-15 MED ORDER — ONDANSETRON 8 MG PO TBDP: 8.0000 mg | ORAL_TABLET | Freq: Three times a day (TID) | ORAL | 0 refills | Status: DC | PRN

## 2017-07-15 MED ORDER — ALBUTEROL SULFATE HFA 108 (90 BASE) MCG/ACT IN AERS
2.0000 | INHALATION_SPRAY | Freq: Once | RESPIRATORY_TRACT | Status: AC
  Administered 2017-07-15: 2 via RESPIRATORY_TRACT
  Filled 2017-07-15: qty 6.7

## 2017-07-15 MED ORDER — GI COCKTAIL ~~LOC~~
30.0000 mL | Freq: Once | ORAL | Status: AC
  Administered 2017-07-15: 30 mL via ORAL
  Filled 2017-07-15: qty 30

## 2017-07-15 MED ORDER — OMEPRAZOLE 20 MG PO CPDR: 20.0000 mg | DELAYED_RELEASE_CAPSULE | Freq: Every day | ORAL | 0 refills | Status: DC

## 2017-07-15 MED ORDER — SUCRALFATE 1 G PO TABS: 1.0000 g | ORAL_TABLET | Freq: Three times a day (TID) | ORAL | 0 refills | Status: DC

## 2017-07-15 NOTE — ED Provider Notes (Signed)
[facility] EMERGENCY DEPARTMENT Provider Note   CSN: [id number] Arrival date & time: 07/14/17  2249     History   Chief Complaint Chief Complaint  Patient presents with  . Asthma  . Emesis    HPI Bailey Baker is a 26 y.o. female.  HPI Bailey Baker is a 26 y.o. female with history of asthma, presents to emergency department wheezing, nausea, vomiting, tingling in her feet.  Patient states her symptoms began several days ago.  She states she has not been able to eat any solids, states she is able to sip some liquids.  She states any time she eats she feels nauseated and throws up.  She states last time this happened to her she was pregnant.  She did not take a pregnancy test.  Her last menstrual period was month ago.  She denies any abdominal pain.  She states she has had some tingling in bilateral feet.  She denies any weakness in her legs or feet.  She reports associated back pain.  Denies any injuries.  States she is coughing and wheezing.  Ran out of her inhaler.  Did not try any medications prior to coming in.  Past Medical History:  Diagnosis Date  . Asthma     There are no active problems to display for this patient.   Past Surgical History:  Procedure Laterality Date  . NO PAST SURGERIES      OB History    Gravida Para Term Preterm AB Living   1 1 1  0 0 1   SAB TAB Ectopic Multiple Live Births   0 0 0 0 1       Home Medications    Prior to Admission medications   Medication Sig Start Date End Date Taking? Authorizing Provider  beclomethasone (QVAR) 80 MCG/ACT inhaler Inhale 2 puffs into the lungs every 4 (four) hours.    [provider]  drospirenone-ethinyl estradiol (YAZ,GIANVI,LORYNA) 3-0.02 MG tablet Take 1 tablet by mouth daily.    [provider]  griseofulvin (GRIFULVIN V) 500 MG tablet Take 1 tablet (500 mg total) by mouth daily. 04/15/14   Gilda Crease, MD  omeprazole (PRILOSEC) 20 MG capsule Take 1  capsule (20 mg total) by mouth daily. 07/15/17   Kallin Henk, PA-C  ondansetron (ZOFRAN ODT) 8 MG disintegrating tablet Take 1 tablet (8 mg total) by mouth every 8 (eight) hours as needed for nausea or vomiting. 07/15/17   Printice Hellmer, Lemont Fillers, PA-C  predniSONE (DELTASONE) 20 MG tablet Take 2 tablets (40 mg total) by mouth daily. 07/15/17   Keron Neenan, PA-C  sucralfate (CARAFATE) 1 g tablet Take 1 tablet (1 g total) by mouth 4 (four) times daily -  with meals and at bedtime. 07/15/17   Montague Corella, PA-C  sulfamethoxazole-trimethoprim (SEPTRA DS) 800-160 MG per tablet Take 1 tablet by mouth every 12 (twelve) hours. 04/15/14   Gilda Crease, MD    Family History Family History  Problem Relation Age of Onset  . Other Neg Hx     Social History Social History   Tobacco Use  . Smoking status: Former Games developer  . Smokeless tobacco: Never Used  Substance Use Topics  . Alcohol use: Yes    Comment: occ  . Drug use: No     Allergies   Milk-related compounds   Review of Systems Review of Systems  Constitutional: Negative for chills and fever.  Respiratory: Positive for cough, shortness of breath and wheezing. Negative for chest  tightness.   Cardiovascular: Negative for chest pain, palpitations and leg swelling.  Gastrointestinal: Positive for nausea and vomiting. Negative for abdominal pain and diarrhea.  Genitourinary: Negative for dysuria, flank pain, pelvic pain, vaginal bleeding, vaginal discharge and vaginal pain.  Musculoskeletal: Positive for arthralgias and back pain. Negative for myalgias, neck pain and neck stiffness.  Skin: Negative for rash.  Neurological: Positive for numbness. Negative for dizziness, weakness and headaches.  All other systems reviewed and are negative.    Physical Exam Updated Vital Signs BP 120/84   Pulse 74   Temp 98.1 F (36.7 C) (Oral)   Resp 18   Ht 5\' 3"  (1.6 m)   Wt 75 kg (165 lb 5.5 oz)   SpO2 100%   BMI  29.29 kg/m   Physical Exam  Constitutional: She is oriented to person, place, and time. She appears well-developed and well-nourished. No distress.  HENT:  Head: Normocephalic.  Eyes: Conjunctivae are normal.  Neck: Neck supple.  Cardiovascular: Normal rate, regular rhythm and normal heart sounds.  Pulmonary/Chest: Effort normal and breath sounds normal. No respiratory distress. She has no wheezes. She has no rales.  Abdominal: Soft. Bowel sounds are normal. She exhibits no distension. There is no tenderness. There is no rebound.  Musculoskeletal: She exhibits no edema.  No midline lumbar spine tenderness.  Pain with bilateral straight leg raise.  Neurological: She is alert and oriented to person, place, and time.  Patella and Achilles reflexes are 2+ and equal bilaterally.  Strength is 5 out of 5 and equal bilateral in both lower extremities  Skin: Skin is warm and dry.  Psychiatric: She has a normal mood and affect. Her behavior is normal.  Nursing note and vitals reviewed.    ED Treatments / Results  Labs (all labs ordered are listed, but only abnormal results are displayed) Labs Reviewed  COMPREHENSIVE METABOLIC PANEL - Abnormal; Notable for the following components:      Result Value   Potassium 3.2 (*)    ALT 10 (*)    All other components within normal limits  URINALYSIS, ROUTINE W REFLEX MICROSCOPIC - Abnormal; Notable for the following components:   APPearance CLOUDY (*)    Hgb urine dipstick LARGE (*)    Squamous Epithelial / LPF 0-5 (*)    All other components within normal limits  LIPASE, BLOOD  CBC  I-STAT BETA HCG BLOOD, ED ([facility], [facility], [facility] ONLY)    EKG  EKG Interpretation None       Radiology No results found.  Procedures Procedures (including critical care time)  Medications Ordered in ED Medications  ondansetron (ZOFRAN-ODT) disintegrating tablet 4 mg (4 mg Oral Given 07/14/17 2313)  gi cocktail (Maalox,Lidocaine,Donnatal) (30 mLs Oral Given  07/15/17 0231)  albuterol (PROVENTIL HFA;VENTOLIN HFA) 108 (90 Base) MCG/ACT inhaler 2 puff (2 puffs Inhalation Given 07/15/17 0231)  potassium chloride SA (K-DUR,KLOR-CON) CR tablet 40 mEq (40 mEq Oral Given 07/15/17 0231)     Initial Impression / Assessment and Plan / ED Course  I have reviewed the triage vital signs and the nursing notes.  Pertinent labs & imaging results that were available during my care of the patient were reviewed by me and considered in my medical decision making (see chart for details).     Patient emergency department with multiple complaints, states she is here to get a pregnancy test, states she is having nausea after eating.  Some epigastric pain that comes and goes currently none.  She is also complaining  of asthma problems, ran out of inhaler.  She is complaining of back pain that radiates into her feet with some paresthesia in both legs.  On exam, no concerning findings.  Reflexes intact, doubt Guyon Barr.  No signs of cauda equina.  She is ambulatory with no distress in the hallway.  She is tolerating oral fluids here after Zofran which was given in triage.  I will provide her with inhaler for asthma, no wheezing on exam.  I will start of steroids for both back and asthma issues.  Zofran for nausea vomiting.  I suspect her pain in epigastric area will together with nausea and vomiting is most likely due to gastritis.  Will start her on Carafate and PPI.  She is not pregnant.  At this time she stable for discharge home.  I do not think she needs any imaging or further treatment in emergency department.  Vitals:   07/14/17 2307 07/15/17 0200 07/15/17 0230  BP: 140/90 114/83 120/84  Pulse: 99 93 74  Resp: 18    Temp: 98.1 F (36.7 C)    TempSrc: Oral    SpO2: 100% 100% 100%  Weight: 75 kg (165 lb 5.5 oz)    Height: 5\' 3"  (1.6 m)       Final Clinical Impressions(s) / ED Diagnoses   Final diagnoses:  Non-intractable vomiting with nausea, unspecified  vomiting type  Paresthesia  Mild intermittent asthma with exacerbation    ED Discharge Orders        Ordered    sucralfate (CARAFATE) 1 g tablet  3 times daily with meals & bedtime     07/15/17 0222    omeprazole (PRILOSEC) 20 MG capsule  Daily     07/15/17 0222    ondansetron (ZOFRAN ODT) 8 MG disintegrating tablet  Every 8 hours PRN     07/15/17 0222    predniSONE (DELTASONE) 20 MG tablet  Daily     07/15/17 0222       Jaynie CrumbleKirichenko, Matei Magnone, PA-C 07/15/17 0636    Little, Ambrose Finlandachel Morgan, MD 07/15/17 [phone]

## 2017-07-15 NOTE — Discharge Instructions (Signed)
Take Zofran as prescribed as needed for nausea and vomiting.  Take Carafate and Prilosec for your stomach problems.  Use inhaler 2 puffs every 4 hours for asthma.  Prednisone as prescribed until gone for tingling and for asthma.  Follow-up with family doctor if not improving. Return if worsening.

## 2018-07-08 ENCOUNTER — Encounter (HOSPITAL_COMMUNITY): Payer: Self-pay

## 2018-07-08 ENCOUNTER — Other Ambulatory Visit: Payer: Self-pay

## 2018-07-08 ENCOUNTER — Emergency Department (HOSPITAL_COMMUNITY)
Admission: EM | Admit: 2018-07-08 | Discharge: 2018-07-08 | Disposition: A | Discharge status: Home / Self Care | Payer: Self-pay | Attending: Emergency Medicine | Admitting: Emergency Medicine

## 2018-07-08 DIAGNOSIS — Z87891 Personal history of nicotine dependence: Secondary | ICD-10-CM | POA: Insufficient documentation

## 2018-07-08 DIAGNOSIS — Z79899 Other long term (current) drug therapy: Secondary | ICD-10-CM | POA: Insufficient documentation

## 2018-07-08 DIAGNOSIS — H9201 Otalgia, right ear: Secondary | ICD-10-CM | POA: Insufficient documentation

## 2018-07-08 DIAGNOSIS — H6121 Impacted cerumen, right ear: Secondary | ICD-10-CM | POA: Insufficient documentation

## 2018-07-08 DIAGNOSIS — R102 Pelvic and perineal pain: Secondary | ICD-10-CM | POA: Insufficient documentation

## 2018-07-08 LAB — URINALYSIS, ROUTINE W REFLEX MICROSCOPIC
Bilirubin Urine: NEGATIVE
Glucose, UA: NEGATIVE mg/dL
Ketones, ur: NEGATIVE mg/dL
Leukocytes, UA: NEGATIVE
Nitrite: NEGATIVE
Protein, ur: NEGATIVE mg/dL
Specific Gravity, Urine: 1.021 (ref 1.005–1.030)
pH: 5 (ref 5.0–8.0)

## 2018-07-08 LAB — POC URINE PREG, ED: Preg Test, Ur: NEGATIVE

## 2018-07-08 NOTE — Discharge Instructions (Addendum)
Please read attached information. If you experience any new or worsening signs or symptoms please return to the emergency room for evaluation. Please follow-up with your primary care provider or specialist as discussed.  °

## 2018-07-08 NOTE — ED Triage Notes (Signed)
Pt states that for the past couple of weeks she has been experiencing abdominal pain and woke up at 6 today with right ear pain.

## 2018-07-08 NOTE — ED Triage Notes (Signed)
Pt states that she has been nauseated and is 6 days late on her cycle.

## 2018-07-08 NOTE — ED Provider Notes (Signed)
[facility] Provider [facility]   CSN: [id number] Arrival date & time: 07/08/18  [id number]     History   Chief Complaint Chief Complaint  Patient presents with  . Otalgia    HPI Bailey Baker is a 27 y.o. female.  HPI   27 year old female presents today with several complaints.  Patient notes that over the last several weeks she has had intermittent pain in her pelvis, she notes this is bilateral cramping in nature coming and going, denies any pelvic or abdominal pain presently.  She denies any dysuria, increased urinary frequency, urine color.  She denies any vaginal discharge or bleeding.  She notes her last menstrual cycle was on December 4 but notes that her cycles are irregular.  Patient denies any upper domino pain or fever.  She does note nausea.  Patient also reports that she woke up this morning with right-sided sharp ear pain.  No upper respiratory symptoms or infectious etiology no trauma to the ear.   Past Medical History:  Diagnosis Date  . Asthma     There are no active problems to display for this patient.   Past Surgical History:  Procedure Laterality Date  . NO PAST SURGERIES       OB History    Gravida  1   Para  1   Term  1   Preterm  0   AB  0   Living  1     SAB  0   TAB  0   Ectopic  0   Multiple  0   Live Births  1            Home Medications    Prior to Admission medications   Medication Sig Start Date End Date Taking? Authorizing Provider  beclomethasone (QVAR) 80 MCG/ACT inhaler Inhale 2 puffs into the lungs every 4 (four) hours.    [provider]  drospirenone-ethinyl estradiol (YAZ,GIANVI,LORYNA) 3-0.02 MG tablet Take 1 tablet by mouth daily.    [provider]  griseofulvin (GRIFULVIN V) 500 MG tablet Take 1 tablet (500 mg total) by mouth daily. 04/15/14   Gilda CreasePollina, Christopher J, MD  omeprazole (PRILOSEC) 20 MG capsule Take 1 capsule (20 mg total) by mouth daily.  07/15/17   Kirichenko, Tatyana, PA-C  ondansetron (ZOFRAN ODT) 8 MG disintegrating tablet Take 1 tablet (8 mg total) by mouth every 8 (eight) hours as needed for nausea or vomiting. 07/15/17   Kirichenko, Lemont Fillersatyana, PA-C  predniSONE (DELTASONE) 20 MG tablet Take 2 tablets (40 mg total) by mouth daily. 07/15/17   Kirichenko, Tatyana, PA-C  sucralfate (CARAFATE) 1 g tablet Take 1 tablet (1 g total) by mouth 4 (four) times daily -  with meals and at bedtime. 07/15/17   Kirichenko, Tatyana, PA-C  sulfamethoxazole-trimethoprim (SEPTRA DS) 800-160 MG per tablet Take 1 tablet by mouth every 12 (twelve) hours. 04/15/14   Gilda CreasePollina, Christopher J, MD    Family History Family History  Problem Relation Age of Onset  . Other Neg Hx     Social History Social History   Tobacco Use  . Smoking status: Former Games developermoker  . Smokeless tobacco: Never Used  Substance Use Topics  . Alcohol use: Yes    Comment: occ  . Drug use: No     Allergies   Milk-related compounds   Review of Systems Review of Systems  All other systems reviewed and are negative.    Physical Exam Updated Vital Signs BP 131/90 (BP  Location: Right Arm)   Pulse 92   Temp 98.3 F (36.8 C) (Oral)   Resp 18   Ht 5\' 3"  (1.6 m)   Wt 74.8 kg   LMP 05/27/2018   SpO2 100%   BMI 29.23 kg/m   Physical Exam Vitals signs and nursing note reviewed.  Constitutional:      Appearance: She is well-developed.  HENT:     Head: Normocephalic and atraumatic.     Comments: Right-sided TM impaction with cerumen (status post impaction auditory canal clear TM normal intact with no erythema) Eyes:     General: No scleral icterus.       Right eye: No discharge.        Left eye: No discharge.     Conjunctiva/sclera: Conjunctivae normal.     Pupils: Pupils are equal, round, and reactive to light.  Neck:     Musculoskeletal: Normal range of motion.     Vascular: No JVD.     Trachea: No tracheal deviation.  Pulmonary:     Effort: Pulmonary  effort is normal.     Breath sounds: No stridor.  Abdominal:     General: Abdomen is flat. There is no distension.     Palpations: Abdomen is soft. There is no mass.     Tenderness: There is no abdominal tenderness. There is no guarding or rebound.     Hernia: No hernia is present.  Neurological:     Mental Status: She is alert and oriented to person, place, and time.     Coordination: Coordination normal.  Psychiatric:        Behavior: Behavior normal.        Thought Content: Thought content normal.        Judgment: Judgment normal.      ED Treatments / Results  Labs (all labs ordered are listed, but only abnormal results are displayed) Labs Reviewed  URINALYSIS, ROUTINE W REFLEX MICROSCOPIC - Abnormal; Notable for the following components:      Result Value   Hgb urine dipstick SMALL (*)    Bacteria, UA RARE (*)    All other components within normal limits  POC URINE PREG, ED    EKG None  Radiology No results found.  Procedures Procedures (including critical care time)  Medications Ordered in ED Medications - No data to display   Initial Impression / Assessment and Plan / ED Course  I have reviewed the triage vital signs and the nursing notes.  Pertinent labs & imaging results that were available during my care of the patient were reviewed by me and considered in my medical decision making (see chart for details).    27 year old female presents today with intermittent pelvic pain, her pregnancy test is negative, no pain presently.  Patient will follow-up with OB for irregular cycles and pelvic pain.  Pain relieved in her ear with removal of cerumen.  Return precautions given.  She verbalized understanding and agreement to today's plan had no further questions or concerns.  Final Clinical Impressions(s) / ED Diagnoses   Final diagnoses:  Right ear pain  Pelvic pain    ED Discharge Orders    None       Rosalio LoudHedges, Miken Stecher, PA-C 07/08/18 1114    Shaune PollackIsaacs,  Cameron, MD 07/09/18 1452

## 2019-03-13 ENCOUNTER — Encounter (HOSPITAL_COMMUNITY): Payer: Self-pay | Admitting: Emergency Medicine

## 2019-03-13 ENCOUNTER — Emergency Department (HOSPITAL_COMMUNITY)
Admission: EM | Admit: 2019-03-13 | Discharge: 2019-03-14 | Disposition: A | Discharge status: Home / Self Care | Payer: Medicaid Other | Attending: Emergency Medicine | Admitting: Emergency Medicine

## 2019-03-13 ENCOUNTER — Other Ambulatory Visit: Payer: Self-pay

## 2019-03-13 DIAGNOSIS — R200 Anesthesia of skin: Secondary | ICD-10-CM | POA: Diagnosis present

## 2019-03-13 DIAGNOSIS — Z87891 Personal history of nicotine dependence: Secondary | ICD-10-CM | POA: Diagnosis not present

## 2019-03-13 DIAGNOSIS — R5383 Other fatigue: Secondary | ICD-10-CM | POA: Insufficient documentation

## 2019-03-13 DIAGNOSIS — J45909 Unspecified asthma, uncomplicated: Secondary | ICD-10-CM | POA: Insufficient documentation

## 2019-03-13 LAB — COMPREHENSIVE METABOLIC PANEL
ALT: 15 U/L (ref 0–44)
AST: 16 U/L (ref 15–41)
Albumin: 3.7 g/dL (ref 3.5–5.0)
Alkaline Phosphatase: 67 U/L (ref 38–126)
Anion gap: 8 (ref 5–15)
BUN: 9 mg/dL (ref 6–20)
CO2: 25 mmol/L (ref 22–32)
Calcium: 9 mg/dL (ref 8.9–10.3)
Chloride: 108 mmol/L (ref 98–111)
Creatinine, Ser: 0.93 mg/dL (ref 0.44–1.00)
GFR calc Af Amer: >60 mL/min (ref >60)
GFR calc non Af Amer: >60 mL/min (ref >60)
Glucose, Bld: 98 mg/dL (ref 70–99)
Potassium: 3.7 mmol/L (ref 3.5–5.1)
Sodium: 141 mmol/L (ref 135–145)
Total Bilirubin: 0.4 mg/dL (ref 0.3–1.2)
Total Protein: 7.3 g/dL (ref 6.5–8.1)

## 2019-03-13 LAB — CBC WITH DIFFERENTIAL/PLATELET
Abs Immature Granulocytes: 0.02 10*3/uL (ref 0.00–0.07)
Basophils Absolute: 0.1 10*3/uL (ref 0.0–0.1)
Basophils Relative: 1 %
Eosinophils Absolute: 0.3 10*3/uL (ref 0.0–0.5)
Eosinophils Relative: 4 %
HCT: 40.4 % (ref 36.0–46.0)
Hemoglobin: 13.5 g/dL (ref 12.0–15.0)
Immature Granulocytes: 0 %
Lymphocytes Relative: 36 %
Lymphs Abs: 2.8 10*3/uL (ref 0.7–4.0)
MCH: 30.3 pg (ref 26.0–34.0)
MCHC: 33.4 g/dL (ref 30.0–36.0)
MCV: 90.8 fL (ref 80.0–100.0)
Monocytes Absolute: 0.9 10*3/uL (ref 0.1–1.0)
Monocytes Relative: 12 %
Neutro Abs: 3.6 10*3/uL (ref 1.7–7.7)
Neutrophils Relative %: 47 %
Platelets: 282 10*3/uL (ref 150–400)
RBC: 4.45 MIL/uL (ref 3.87–5.11)
RDW: 11.9 % (ref 11.5–15.5)
WBC: 7.6 10*3/uL (ref 4.0–10.5)
nRBC: 0 % (ref 0.0–0.2)

## 2019-03-13 LAB — I-STAT BETA HCG BLOOD, ED (MC, WL, AP ONLY): I-stat hCG, quantitative: <5 m[IU]/mL (ref <5)

## 2019-03-13 NOTE — ED Triage Notes (Signed)
Patient reports generalized body numbness/shaking after eating supper this evening , alert and oriented , respirations unlabored / denies pain .

## 2019-03-14 NOTE — ED Provider Notes (Signed)
[facility]   CSN: [id number] Arrival date & time: 03/13/19  2206     History   Chief Complaint Chief Complaint  Patient presents with  . Body Numbness / Shaking    HPI Arvid Rightbony Fassler is a 28 y.o. female.     Patient to ED for evaluation of symptoms of extremity 'heaviness' that started after eating chicken wings around 10:00 pm last evening (8.23.20). She felt her eyelids become heavy, then her legs. She reports mild nausea without vomiting. Symptoms resolved after one hour. She denies headache, congestion, cough, SOB, chest pain, abdominal pain, vaginal discharge or abnormal bleeding, urinary symptoms. She feels she has been eating and drinking per her usual. No new medications.  The history is provided by the patient. No language interpreter was used.    Past Medical History:  Diagnosis Date  . Asthma     There are no active problems to display for this patient.   Past Surgical History:  Procedure Laterality Date  . NO PAST SURGERIES       OB History    Gravida  1   Para  1   Term  1   Preterm  0   AB  0   Living  1     SAB  0   TAB  0   Ectopic  0   Multiple  0   Live Births  1            Home Medications    Prior to Admission medications   Medication Sig Start Date End Date Taking? Authorizing Provider  beclomethasone (QVAR) 80 MCG/ACT inhaler Inhale 2 puffs into the lungs every 4 (four) hours.    [provider]  drospirenone-ethinyl estradiol (YAZ,GIANVI,LORYNA) 3-0.02 MG tablet Take 1 tablet by mouth daily.    [provider]  griseofulvin (GRIFULVIN V) 500 MG tablet Take 1 tablet (500 mg total) by mouth daily. 04/15/14   Gilda CreasePollina, Christopher J, MD  omeprazole (PRILOSEC) 20 MG capsule Take 1 capsule (20 mg total) by mouth daily. 07/15/17   Kirichenko, Tatyana, PA-C  ondansetron (ZOFRAN ODT) 8 MG disintegrating tablet Take 1 tablet (8 mg total) by mouth every 8  (eight) hours as needed for nausea or vomiting. 07/15/17   Kirichenko, Lemont Fillersatyana, PA-C  predniSONE (DELTASONE) 20 MG tablet Take 2 tablets (40 mg total) by mouth daily. 07/15/17   Kirichenko, Tatyana, PA-C  sucralfate (CARAFATE) 1 g tablet Take 1 tablet (1 g total) by mouth 4 (four) times daily -  with meals and at bedtime. 07/15/17   Kirichenko, Tatyana, PA-C  sulfamethoxazole-trimethoprim (SEPTRA DS) 800-160 MG per tablet Take 1 tablet by mouth every 12 (twelve) hours. 04/15/14   Gilda CreasePollina, Christopher J, MD    Family History Family History  Problem Relation Age of Onset  . Other Neg Hx     Social History Social History   Tobacco Use  . Smoking status: Former Games developermoker  . Smokeless tobacco: Never Used  Substance Use Topics  . Alcohol use: Yes    Comment: occ  . Drug use: No     Allergies   Milk-related compounds   Review of Systems Review of Systems  Constitutional: Positive for fatigue. Negative for chills and fever.  HENT: Negative.   Respiratory: Negative.   Cardiovascular: Negative.   Gastrointestinal: Negative.   Musculoskeletal:       See HPI.  Skin: Negative.   Neurological: Negative.      Physical  Exam Updated Vital Signs BP (!) 143/103 (BP Location: Right Arm)   Pulse (!) 106   Temp 98.6 F (37 C) (Oral)   Resp 16   Ht 5\' 3"  (1.6 m)   Wt 83.9 kg   LMP 01/20/2019 (Approximate)   SpO2 98%   BMI 32.77 kg/m   Physical Exam Constitutional:      Appearance: She is well-developed.  HENT:     Head: Normocephalic.  Eyes:     General: No visual field deficit. Neck:     Musculoskeletal: Normal range of motion and neck supple.  Cardiovascular:     Rate and Rhythm: Normal rate and regular rhythm.  Pulmonary:     Effort: Pulmonary effort is normal.     Breath sounds: Normal breath sounds.  Abdominal:     General: Bowel sounds are normal.     Palpations: Abdomen is soft.     Tenderness: There is no abdominal tenderness. There is no guarding or rebound.   Musculoskeletal: Normal range of motion.  Skin:    General: Skin is warm and dry.     Findings: No rash.  Neurological:     General: No focal deficit present.     Mental Status: She is alert and oriented to person, place, and time.     GCS: GCS eye subscore is 4. GCS verbal subscore is 5. GCS motor subscore is 6.     Cranial Nerves: No dysarthria or facial asymmetry.     Sensory: Sensation is intact.     Motor: Motor function is intact.     Coordination: Coordination is intact.     Gait: Gait is intact.      ED Treatments / Results  Labs (all labs ordered are listed, but only abnormal results are displayed) Labs Reviewed  CBC WITH DIFFERENTIAL/PLATELET  COMPREHENSIVE METABOLIC PANEL  I-STAT BETA HCG BLOOD, ED ([facility], [facility], [facility] ONLY)    EKG None  Radiology No results found.  Procedures Procedures (including critical care time)  Medications Ordered in ED Medications - No data to display   Initial Impression / Assessment and Plan / ED Course  I have reviewed the triage vital signs and the nursing notes.  Pertinent labs & imaging results that were available during my care of the patient were reviewed by me and considered in my medical decision making (see chart for details).        Patient to ED with vague symptoms of extremity and eye lid heaviness that have completely resolved. Patient reassured.   She discussed she has not been getting her usual amount of sleep. Encouraged to increase rest.   Final Clinical Impressions(s) / ED Diagnoses   Final diagnoses:  None   1. Fatigue  ED Discharge Orders    None       Charlann Lange, Hershal Coria 03/14/19 6720    Mesner, Corene Cornea, MD 03/14/19 [id number]

## 2019-06-23 LAB — OB RESULTS CONSOLE ANTIBODY SCREEN: Antibody Screen: NEGATIVE

## 2019-06-23 LAB — OB RESULTS CONSOLE RPR: RPR: NONREACTIVE

## 2019-06-23 LAB — OB RESULTS CONSOLE HIV ANTIBODY (ROUTINE TESTING): HIV: NONREACTIVE

## 2019-06-23 LAB — OB RESULTS CONSOLE ABO/RH: RH Type: POSITIVE

## 2019-06-23 LAB — OB RESULTS CONSOLE GC/CHLAMYDIA
Chlamydia: NEGATIVE
Gonorrhea: NEGATIVE

## 2019-06-23 LAB — OB RESULTS CONSOLE RUBELLA ANTIBODY, IGM: Rubella: IMMUNE

## 2019-06-23 LAB — OB RESULTS CONSOLE HEPATITIS B SURFACE ANTIGEN: Hepatitis B Surface Ag: NEGATIVE

## 2019-07-22 NOTE — L&D Delivery Note (Signed)
DELIVERY NOTE  Pt complete (progressed quickly after epidural) and at +2 station with urge to push. Epidural somewhat controlling pain. Pt pushed and delivered a viable female infant in LOA position. Anterior and posterior shoulders spontaneously delivered with next two pushes; body easily followed next. Infant placed on mothers abdomen and bulb suction of mouth and nose performed. Cord was then clamped and cut by FOB. Cord blood obtained, 3VC. Baby had a vigorous spontaneous cry noted. Placenta then delivered at 1832 intact. Fundal massage performed and pitocin per protocol. Fundus firm. The following lacerations were noted: shallow 2nd deg. Repaired in routine fashion with 2-0 vicryl Mother and baby stable. Counts correct   Given rapid progression in delivery, PR cytotec placed after repair  Infant time: 1830 Gender: female Placenta time: 1832 Apgars: 9/9 Weight: pending skin-to-skin

## 2019-11-08 ENCOUNTER — Encounter (HOSPITAL_COMMUNITY): Payer: Self-pay | Admitting: Obstetrics and Gynecology

## 2019-11-08 ENCOUNTER — Inpatient Hospital Stay (HOSPITAL_COMMUNITY)
Admission: AD | Admit: 2019-11-08 | Discharge: 2019-11-08 | Disposition: A | Discharge status: Home / Self Care | Payer: Medicaid Other | Attending: Obstetrics and Gynecology | Admitting: Obstetrics and Gynecology

## 2019-11-08 ENCOUNTER — Other Ambulatory Visit: Payer: Self-pay

## 2019-11-08 DIAGNOSIS — Z3A32 32 weeks gestation of pregnancy: Secondary | ICD-10-CM | POA: Diagnosis not present

## 2019-11-08 DIAGNOSIS — Z3689 Encounter for other specified antenatal screening: Secondary | ICD-10-CM | POA: Diagnosis not present

## 2019-11-08 DIAGNOSIS — O26893 Other specified pregnancy related conditions, third trimester: Secondary | ICD-10-CM | POA: Diagnosis not present

## 2019-11-08 DIAGNOSIS — Z0371 Encounter for suspected problem with amniotic cavity and membrane ruled out: Secondary | ICD-10-CM | POA: Diagnosis not present

## 2019-11-08 LAB — URINALYSIS, ROUTINE W REFLEX MICROSCOPIC
Bilirubin Urine: NEGATIVE
Glucose, UA: NEGATIVE mg/dL
Hgb urine dipstick: NEGATIVE
Ketones, ur: NEGATIVE mg/dL
Leukocytes,Ua: NEGATIVE
Nitrite: NEGATIVE
Protein, ur: NEGATIVE mg/dL
Specific Gravity, Urine: 1.019 (ref 1.005–1.030)
pH: 6 (ref 5.0–8.0)

## 2019-11-08 LAB — AMNISURE RUPTURE OF MEMBRANE (ROM) NOT AT ARMC: Amnisure ROM: NEGATIVE

## 2019-11-08 LAB — POCT FERN TEST: POCT Fern Test: NEGATIVE

## 2019-11-08 LAB — WET PREP, GENITAL
Clue Cells Wet Prep HPF POC: NONE SEEN
Sperm: NONE SEEN
Trich, Wet Prep: NONE SEEN
Yeast Wet Prep HPF POC: NONE SEEN
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
## 2019-11-24 ENCOUNTER — Other Ambulatory Visit: Payer: Self-pay

## 2019-11-24 ENCOUNTER — Inpatient Hospital Stay (HOSPITAL_COMMUNITY)
Admission: AD | Admit: 2019-11-24 | Discharge: 2019-11-24 | Disposition: A | Discharge status: Home / Self Care | Payer: Medicaid Other | Attending: Obstetrics and Gynecology | Admitting: Obstetrics and Gynecology

## 2019-11-24 ENCOUNTER — Encounter (HOSPITAL_COMMUNITY): Payer: Self-pay | Admitting: Obstetrics and Gynecology

## 2019-11-24 DIAGNOSIS — Z87891 Personal history of nicotine dependence: Secondary | ICD-10-CM | POA: Insufficient documentation

## 2019-11-24 DIAGNOSIS — M545 Low back pain: Secondary | ICD-10-CM | POA: Insufficient documentation

## 2019-11-24 DIAGNOSIS — O99513 Diseases of the respiratory system complicating pregnancy, third trimester: Secondary | ICD-10-CM | POA: Diagnosis not present

## 2019-11-24 DIAGNOSIS — O26893 Other specified pregnancy related conditions, third trimester: Secondary | ICD-10-CM | POA: Insufficient documentation

## 2019-11-24 DIAGNOSIS — R109 Unspecified abdominal pain: Secondary | ICD-10-CM | POA: Insufficient documentation

## 2019-11-24 DIAGNOSIS — J45909 Unspecified asthma, uncomplicated: Secondary | ICD-10-CM | POA: Insufficient documentation

## 2019-11-24 DIAGNOSIS — Z3A34 34 weeks gestation of pregnancy: Secondary | ICD-10-CM | POA: Insufficient documentation

## 2019-11-24 DIAGNOSIS — O4703 False labor before 37 completed weeks of gestation, third trimester: Secondary | ICD-10-CM | POA: Insufficient documentation

## 2019-11-24 DIAGNOSIS — M79606 Pain in leg, unspecified: Secondary | ICD-10-CM | POA: Insufficient documentation

## 2019-11-24 DIAGNOSIS — R102 Pelvic and perineal pain: Secondary | ICD-10-CM | POA: Insufficient documentation

## 2019-11-24 DIAGNOSIS — Z7901 Long term (current) use of anticoagulants: Secondary | ICD-10-CM | POA: Diagnosis not present

## 2019-11-24 DIAGNOSIS — Z79899 Other long term (current) drug therapy: Secondary | ICD-10-CM | POA: Insufficient documentation

## 2019-11-24 LAB — URINALYSIS, ROUTINE W REFLEX MICROSCOPIC
Bilirubin Urine: NEGATIVE
Glucose, UA: NEGATIVE mg/dL
Hgb urine dipstick: NEGATIVE
Ketones, ur: NEGATIVE mg/dL
Leukocytes,Ua: NEGATIVE
Nitrite: NEGATIVE
Protein, ur: NEGATIVE mg/dL
Specific Gravity, Urine: 1.006 (ref 1.005–1.030)
pH: 7 (ref 5.0–8.0)

## 2019-11-24 MED ORDER — NIFEDIPINE 10 MG PO CAPS
10.0000 mg | ORAL_CAPSULE | ORAL | Status: AC | PRN
  Administered 2019-11-24 (×4): 10 mg via ORAL
  Filled 2019-11-24 (×4): qty 1

## 2019-11-24 NOTE — MAU Note (Signed)
Pt reports irregular contractions and lower back pain that radiates into legs that started today. Reports she has had braxton hicks contractions all week, but this pain is worse. Pt denies LOF or vaginal bleeding. Reports good fetal movement.

## 2019-11-24 NOTE — MAU Provider Note (Signed)
Chief Complaint:  Contractions   First Provider Initiated Contact with Patient 11/24/19 0127     HPI: Bailey Baker is a 29 y.o. G2P1001 at 56w6dwho presents to maternity admissions reporting painful contractions today.  Pain is in lower abdomen, low back and legs.  Has been drinking water.  No history of PTL.  She reports good fetal movement, denies LOF, vaginal bleeding, vaginal itching/burning, urinary symptoms, h/a, dizziness, n/v, diarrhea, constipation or fever/chills.   Abdominal Pain This is a new problem. The current episode started today. The onset quality is gradual. The problem occurs intermittently. The pain is mild. The quality of the pain is cramping. The abdominal pain does not radiate. Pertinent negatives include no constipation, diarrhea, dysuria, fever or frequency. Nothing aggravates the pain. The pain is relieved by nothing. She has tried acetaminophen for the symptoms. The treatment provided no relief.    RN note: Pt reports irregular contractions and lower back pain that radiates into legs that started today. Reports she has had braxton hicks contractions all week, but this pain is worse. Pt denies LOF or vaginal bleeding. Reports good fetal movement  Past Medical History: Past Medical History:  Diagnosis Date  . Asthma     Past obstetric history: OB History  Gravida Para Term Preterm AB Living  2 1 1  0 0 1  SAB TAB Ectopic Multiple Live Births  0 0 0 0 1    # Outcome Date GA Lbr Len/2nd Weight Sex Delivery Anes PTL Lv  2 Current           1 Term 03/31/12 [redacted]w[redacted]d 13:45 / 05:04 2631 g F Vag-Spont EPI  LIV    Past Surgical History: Past Surgical History:  Procedure Laterality Date  . NO PAST SURGERIES      Family History: Family History  Problem Relation Age of Onset  . Hypertension Mother   . Other Neg Hx     Social History: Social History   Tobacco Use  . Smoking status: Former Smoker    Types: Cigarettes    Quit date: 10/19/2016    Years since  quitting: 3.0  . Smokeless tobacco: Never Used  Substance Use Topics  . Alcohol use: Yes    Comment: occ  . Drug use: No    Allergies:  Allergies  Allergen Reactions  . Milk-Related Compounds Swelling and Rash    Meds:  Medications Prior to Admission  Medication Sig Dispense Refill Last Dose  . acetaminophen (TYLENOL) 500 MG tablet Take 500 mg by mouth every 6 (six) hours as needed.   Past Month at Unknown time  . ALBUTEROL IN Inhale into the lungs.   Past Week at Unknown time  . beclomethasone (QVAR) 80 MCG/ACT inhaler Inhale 2 puffs into the lungs every 4 (four) hours.   11/23/2019 at Unknown time  . ferrous sulfate 325 (65 FE) MG EC tablet Take 325 mg by mouth 3 (three) times daily with meals.   11/23/2019 at Unknown time  . Prenatal Vit-Fe Sulfate-FA-DHA (PRENATAL VITAMIN/MIN +DHA) 27-0.8-200 MG CAPS Take 1 capsule by mouth daily.   11/23/2019 at Unknown time  . omeprazole (PRILOSEC) 20 MG capsule Take 1 capsule (20 mg total) by mouth daily. 30 capsule 0   . ondansetron (ZOFRAN ODT) 8 MG disintegrating tablet Take 1 tablet (8 mg total) by mouth every 8 (eight) hours as needed for nausea or vomiting. 20 tablet 0   . predniSONE (DELTASONE) 20 MG tablet Take 2 tablets (40 mg total) by mouth  daily. 10 tablet 0   . sucralfate (CARAFATE) 1 g tablet Take 1 tablet (1 g total) by mouth 4 (four) times daily -  with meals and at bedtime. 30 tablet 0     I have reviewed patient's Past Medical Hx, Surgical Hx, Family Hx, Social Hx, medications and allergies.   ROS:  Review of Systems  Constitutional: Negative for fever.  Respiratory: Negative for shortness of breath.   Gastrointestinal: Positive for abdominal pain. Negative for constipation and diarrhea.  Genitourinary: Positive for pelvic pain. Negative for dysuria, frequency and vaginal bleeding.  Musculoskeletal: Positive for back pain.  Neurological: Negative for dizziness and weakness.   Other systems negative  Physical Exam    Patient Vitals for the past 24 hrs:  BP Temp Temp src Pulse Resp SpO2  11/24/19 0111 133/87 98.1 F (36.7 C) Oral 97 18 100 %   Vitals:   11/24/19 0143 11/24/19 0214 11/24/19 0237 11/24/19 0303  BP: 119/72 128/88 121/68 116/72  Pulse: 85 99 (!) 102 (!) 105  Resp:      Temp:      TempSrc:      SpO2:        Constitutional: Well-developed, well-nourished female in no acute distress.  Cardiovascular: normal rate and rhythm Respiratory: normal effort, clear to auscultation bilaterally GI: Abd soft, non-tender, gravid appropriate for gestational age.   No rebound or guarding. MS: Extremities nontender, no edema, normal ROM Neurologic: Alert and oriented x 4.  GU: Neg CVAT.  PELVIC EXAM:  Dilation: Closed Effacement (%): 30 Station: Ballotable Exam by:: Wynelle Bourgeois, CNM   Recheck of Cervix 2 hours later was unchanged  FHT:  Baseline 140 , moderate variability, accelerations present, no decelerations Contractions: q 4 mins Irregular     Labs: Results for orders placed or performed during the hospital encounter of 11/24/19 (from the past 24 hour(s))  Urinalysis, Routine w reflex microscopic     Status: None   Collection Time: 11/24/19  1:19 AM  Result Value Ref Range   Color, Urine YELLOW YELLOW   APPearance CLEAR CLEAR   Specific Gravity, Urine 1.006 1.005 - 1.030   pH 7.0 5.0 - 8.0   Glucose, UA NEGATIVE NEGATIVE mg/dL   Hgb urine dipstick NEGATIVE NEGATIVE   Bilirubin Urine NEGATIVE NEGATIVE   Ketones, ur NEGATIVE NEGATIVE mg/dL   Protein, ur NEGATIVE NEGATIVE mg/dL   Nitrite NEGATIVE NEGATIVE   Leukocytes,Ua NEGATIVE NEGATIVE   Imaging:  No results found.  MAU Course/MDM: I have ordered labs and reviewed results. Urine is very dilute and shows no sign of infection NST reviewed, reactive and Category I  Treatments in MAU included Procardia series x 4 doses  >> UCs diminished in frequency and strength and cervix was unchanged 2 hours after first  exam  Assessment: Single IUP at [redacted]w[redacted]d Preterm uterine contractions without cervical change  Plan: Discharge home Preterm Labor precautions and fetal kick counts Follow up in Office for prenatal visits and recheck of cervix, has appt this Friday  Encouraged to return here or to other Urgent Care/ED if she develops worsening of symptoms, increase in pain, fever, or other concerning symptoms.   Pt stable at time of discharge.  Wynelle Bourgeois CNM, MSN Certified Nurse-Midwife 11/24/2019 1:27 AM

## 2019-11-24 NOTE — Discharge Instructions (Signed)
Preterm Labor and Birth Information ° °The normal length of a pregnancy is 39-41 weeks. Preterm labor is when labor starts before 37 completed weeks of pregnancy. °What are the risk factors for preterm labor? °Preterm labor is more likely to occur in women who: °· Have certain infections during pregnancy such as a bladder infection, sexually transmitted infection, or infection inside the uterus (chorioamnionitis). °· Have a shorter-than-normal cervix. °· Have gone into preterm labor before. °· Have had surgery on their cervix. °· Are younger than age 17 or older than age 35. °· Are African American. °· Are pregnant with twins or multiple babies (multiple gestation). °· Take street drugs or smoke while pregnant. °· Do not gain enough weight while pregnant. °· Became pregnant shortly after having been pregnant. °What are the symptoms of preterm labor? °Symptoms of preterm labor include: °· Cramps similar to those that can happen during a menstrual period. The cramps may happen with diarrhea. °· Pain in the abdomen or lower back. °· Regular uterine contractions that may feel like tightening of the abdomen. °· A feeling of increased pressure in the pelvis. °· Increased watery or bloody mucus discharge from the vagina. °· Water breaking (ruptured amniotic sac). °Why is it important to recognize signs of preterm labor? °It is important to recognize signs of preterm labor because babies who are born prematurely may not be fully developed. This can put them at an increased risk for: °· Long-term (chronic) heart and lung problems. °· Difficulty immediately after birth with regulating body systems, including blood sugar, body temperature, heart rate, and breathing rate. °· Bleeding in the brain. °· Cerebral palsy. °· Learning difficulties. °· Death. °These risks are highest for babies who are born before 34 weeks of pregnancy. °How is preterm labor treated? °Treatment depends on the length of your pregnancy, your condition,  and the health of your baby. It may involve: °· Having a stitch (suture) placed in your cervix to prevent your cervix from opening too early (cerclage). °· Taking or being given medicines, such as: °? Hormone medicines. These may be given early in pregnancy to help support the pregnancy. °? Medicine to stop contractions. °? Medicines to help mature the baby’s lungs. These may be prescribed if the risk of delivery is high. °? Medicines to prevent your baby from developing cerebral palsy. °If the labor happens before 34 weeks of pregnancy, you may need to stay in the hospital. °What should I do if I think I am in preterm labor? °If you think that you are going into preterm labor, call your health care provider right away. °How can I prevent preterm labor in future pregnancies? °To increase your chance of having a full-term pregnancy: °· Do not use any tobacco products, such as cigarettes, chewing tobacco, and e-cigarettes. If you need help quitting, ask your health care provider. °· Do not use street drugs or medicines that have not been prescribed to you during your pregnancy. °· Talk with your health care provider before taking any herbal supplements, even if you have been taking them regularly. °· Make sure you gain a healthy amount of weight during your pregnancy. °· Watch for infection. If you think that you might have an infection, get it checked right away. °· Make sure to tell your health care provider if you have gone into preterm labor before. °This information is not intended to replace advice given to you by your health care provider. Make sure you discuss any questions you have with your   health care provider. °Document Revised: 10/29/2018 Document Reviewed: 11/28/2015 °Elsevier Patient Education © 2020 [employer] ° °

## 2019-11-27 ENCOUNTER — Encounter (HOSPITAL_COMMUNITY): Payer: Self-pay | Admitting: Obstetrics and Gynecology

## 2019-11-27 ENCOUNTER — Inpatient Hospital Stay (HOSPITAL_COMMUNITY)
Admission: AD | Admit: 2019-11-27 | Discharge: 2019-11-27 | Disposition: A | Discharge status: Home / Self Care | Payer: Medicaid Other | Attending: Obstetrics and Gynecology | Admitting: Obstetrics and Gynecology

## 2019-11-27 ENCOUNTER — Other Ambulatory Visit: Payer: Self-pay

## 2019-11-27 DIAGNOSIS — Z87891 Personal history of nicotine dependence: Secondary | ICD-10-CM | POA: Diagnosis not present

## 2019-11-27 DIAGNOSIS — O4703 False labor before 37 completed weeks of gestation, third trimester: Secondary | ICD-10-CM

## 2019-11-27 DIAGNOSIS — Z3A35 35 weeks gestation of pregnancy: Secondary | ICD-10-CM | POA: Diagnosis not present

## 2019-11-27 DIAGNOSIS — O26893 Other specified pregnancy related conditions, third trimester: Secondary | ICD-10-CM | POA: Diagnosis present

## 2019-11-27 LAB — URINALYSIS, ROUTINE W REFLEX MICROSCOPIC
Bacteria, UA: NONE SEEN
Bilirubin Urine: NEGATIVE
Glucose, UA: NEGATIVE mg/dL
Hgb urine dipstick: NEGATIVE
Ketones, ur: NEGATIVE mg/dL
Leukocytes,Ua: NEGATIVE
Nitrite: NEGATIVE
Protein, ur: 30 mg/dL — AB
Specific Gravity, Urine: 1.018 (ref 1.005–1.030)
pH: 6 (ref 5.0–8.0)

## 2019-11-27 LAB — AMNISURE RUPTURE OF MEMBRANE (ROM) NOT AT ARMC: Amnisure ROM: NEGATIVE

## 2019-11-27 NOTE — MAU Note (Signed)
Bailey Baker is a 29 y.o. at [redacted]w[redacted]d here in MAU reporting: this AM had some LOF around 0830, soaked through her pants. It was clear. Still having contractions, states they are not bad but are close together. No bleeding. Has not felt any FM today. Last IC was last night.  Onset of complaint: today  Pain score: 5/10  Vitals:   11/27/19 0942  BP: 127/87  Pulse: 99  Resp: 16  Temp: 97.7 F (36.5 C)  SpO2: 100%     FHT:130  Lab orders placed from triage: UA

## 2019-11-27 NOTE — MAU Provider Note (Signed)
Chief Complaint:  Contractions and Rupture of Membranes   First Provider Initiated Contact with Patient 11/27/19 1001      HPI: Bailey Baker is a 29 y.o. G2P1001 at [redacted]w[redacted]d who presents to maternity admissions reporting gush of fluid this morning that wet her underwear and onto her clothes. There is a small amount of leakage since then but has not required a pad.  She does report last intercourse was last night.  She is having some cramping/contractions but these are similar to the last few days and have not changed with the leakage.   She reports good fetal movement. She has not tried any treatments. There are no other symptoms.   HPI  Past Medical History: Past Medical History:  Diagnosis Date  . Asthma     Past obstetric history: OB History  Gravida Para Term Preterm AB Living  2 1 1  0 0 1  SAB TAB Ectopic Multiple Live Births  0 0 0 0 1    # Outcome Date GA Lbr Len/2nd Weight Sex Delivery Anes PTL Lv  2 Current           1 Term 03/31/12 [redacted]w[redacted]d 13:45 / 05:04 2631 g F Vag-Spont EPI  LIV    Past Surgical History: Past Surgical History:  Procedure Laterality Date  . NO PAST SURGERIES      Family History: Family History  Problem Relation Age of Onset  . Hypertension Mother   . Other Neg Hx     Social History: Social History   Tobacco Use  . Smoking status: Former Smoker    Types: Cigarettes    Quit date: 10/19/2016    Years since quitting: 3.1  . Smokeless tobacco: Never Used  Substance Use Topics  . Alcohol use: Yes    Comment: occ  . Drug use: No    Allergies:  Allergies  Allergen Reactions  . Milk-Related Compounds Swelling and Rash    Meds:  Medications Prior to Admission  Medication Sig Dispense Refill Last Dose  . acetaminophen (TYLENOL) 500 MG tablet Take 500 mg by mouth every 6 (six) hours as needed.     . ALBUTEROL IN Inhale into the lungs.     . beclomethasone (QVAR) 80 MCG/ACT inhaler Inhale 2 puffs into the lungs every 4 (four) hours.     .  ferrous sulfate 325 (65 FE) MG EC tablet Take 325 mg by mouth 3 (three) times daily with meals.     12/19/2016 omeprazole (PRILOSEC) 20 MG capsule Take 1 capsule (20 mg total) by mouth daily. 30 capsule 0   . ondansetron (ZOFRAN ODT) 8 MG disintegrating tablet Take 1 tablet (8 mg total) by mouth every 8 (eight) hours as needed for nausea or vomiting. 20 tablet 0   . predniSONE (DELTASONE) 20 MG tablet Take 2 tablets (40 mg total) by mouth daily. 10 tablet 0   . Prenatal Vit-Fe Sulfate-FA-DHA (PRENATAL VITAMIN/MIN +DHA) 27-0.8-200 MG CAPS Take 1 capsule by mouth daily.     . sucralfate (CARAFATE) 1 g tablet Take 1 tablet (1 g total) by mouth 4 (four) times daily -  with meals and at bedtime. 30 tablet 0     ROS:  Review of Systems  Constitutional: Negative for chills, fatigue and fever.  Eyes: Negative for visual disturbance.  Respiratory: Negative for shortness of breath.   Cardiovascular: Negative for chest pain.  Gastrointestinal: Positive for abdominal pain. Negative for nausea and vomiting.  Genitourinary: Positive for vaginal discharge. Negative for difficulty  urinating, dysuria, flank pain, pelvic pain, vaginal bleeding and vaginal pain.  Neurological: Negative for dizziness and headaches.  Psychiatric/Behavioral: Negative.      I have reviewed patient's Past Medical Hx, Surgical Hx, Family Hx, Social Hx, medications and allergies.   Physical Exam   Patient Vitals for the past 24 hrs:  BP Temp Temp src Pulse Resp SpO2 Height Weight  11/27/19 0942 127/87 97.7 F (36.5 C) Oral 99 16 100 % 5' 2.5" (1.588 m) 83.6 kg   Constitutional: Well-developed, well-nourished female in no acute distress.  Cardiovascular: normal rate Respiratory: normal effort GI: Abd soft, non-tender, gravid appropriate for gestational age.  MS: Extremities nontender, no edema, normal ROM Neurologic: Alert and oriented x 4.  GU: Neg CVAT.  PELVIC EXAM: Cervix pink, visually closed, without lesion, scant white  creamy discharge, vaginal walls and external genitalia normal Bimanual exam: Cervix 0/long/high, firm, anterior, neg CMT, uterus nontender, nonenlarged, adnexa without tenderness, enlargement, or mass  Dilation: Closed Effacement (%): 30 Exam by:: Jayren Cease leftwich kirby cnm  FHT:  Baseline 135 , moderate variability, accelerations present, no decelerations Contractions: q 5-6 mins, then infrequent, Q 15-20 after PO fluids   Labs: Results for orders placed or performed during the hospital encounter of 11/27/19 (from the past 24 hour(s))  Urinalysis, Routine w reflex microscopic     Status: Abnormal   Collection Time: 11/27/19 10:38 AM  Result Value Ref Range   Color, Urine YELLOW YELLOW   APPearance HAZY (A) CLEAR   Specific Gravity, Urine 1.018 1.005 - 1.030   pH 6.0 5.0 - 8.0   Glucose, UA NEGATIVE NEGATIVE mg/dL   Hgb urine dipstick NEGATIVE NEGATIVE   Bilirubin Urine NEGATIVE NEGATIVE   Ketones, ur NEGATIVE NEGATIVE mg/dL   Protein, ur 30 (A) NEGATIVE mg/dL   Nitrite NEGATIVE NEGATIVE   Leukocytes,Ua NEGATIVE NEGATIVE   RBC / HPF 0-5 0 - 5 RBC/hpf   WBC, UA 0-5 0 - 5 WBC/hpf   Bacteria, UA NONE SEEN NONE SEEN   Squamous Epithelial / LPF 0-5 0 - 5   Mucus PRESENT    Hyaline Casts, UA PRESENT   Amnisure rupture of membrane (rom)not at [facility]     Status: None   Collection Time: 11/27/19 10:54 AM  Result Value Ref Range   Amnisure ROM NEGATIVE       Imaging:  No results found.  MAU Course/MDM: Orders Placed This Encounter  Procedures  . Culture, beta strep (group b only)  . Urinalysis, Routine w reflex microscopic  . Amnisure rupture of membrane (rom)not at [facility]  . Discharge patient    No orders of the defined types were placed in this encounter.    NST reviewed and reactive No evidence of preterm labor or PPROM today with closed thick cervix and negative pooling, ferning, and amnisure Labor precautions reviewed Pt to keep appt in the office on 12/01/19 Return to MAU  as needed for emergencies  Assessment: 1. Threatened premature labor in third trimester     Plan: Discharge home Labor precautions and fetal kick counts Follow-up Information    [facility] Follow up.   Why: As scheduled, return to MAU with signs of labor or emergencies. Contact information: [address]  [address] [place] [address] [phone]          Allergies as of 11/27/2019      Reactions   Milk-related Compounds Swelling, Rash      Medication List    STOP taking  these medications   predniSONE 20 MG tablet Commonly known as: DELTASONE     TAKE these medications   acetaminophen 500 MG tablet Commonly known as: TYLENOL Take 500 mg by mouth every 6 (six) hours as needed.   ALBUTEROL IN Inhale into the lungs.   beclomethasone 80 MCG/ACT inhaler Commonly known as: QVAR Inhale 2 puffs into the lungs every 4 (four) hours.   ferrous sulfate 325 (65 FE) MG EC tablet Take 325 mg by mouth 3 (three) times daily with meals.   omeprazole 20 MG capsule Commonly known as: PRILOSEC Take 1 capsule (20 mg total) by mouth daily.   ondansetron 8 MG disintegrating tablet Commonly known as: Zofran ODT Take 1 tablet (8 mg total) by mouth every 8 (eight) hours as needed for nausea or vomiting.   Prenatal Vitamin/Min +DHA 27-0.8-200 MG Caps Take 1 capsule by mouth daily.   sucralfate 1 g tablet Commonly known as: Carafate Take 1 tablet (1 g total) by mouth 4 (four) times daily -  with meals and at bedtime.       Sharen Counter Certified Nurse-Midwife 11/27/2019 12:30 PM

## 2019-11-28 LAB — GC/CHLAMYDIA PROBE AMP (~~LOC~~) NOT AT ARMC
Chlamydia: NEGATIVE
Comment: NEGATIVE
Comment: NORMAL
Neisseria Gonorrhea: NEGATIVE

## 2019-11-29 ENCOUNTER — Encounter: Payer: Self-pay | Admitting: Advanced Practice Midwife

## 2019-11-29 DIAGNOSIS — O99820 Streptococcus B carrier state complicating pregnancy: Secondary | ICD-10-CM | POA: Insufficient documentation

## 2019-11-29 LAB — CULTURE, BETA STREP (GROUP B ONLY)

## 2019-11-30 ENCOUNTER — Inpatient Hospital Stay (HOSPITAL_COMMUNITY)
Admission: AD | Admit: 2019-11-30 | Discharge: 2019-11-30 | Disposition: A | Discharge status: Home / Self Care | Payer: Medicaid Other | Attending: Obstetrics and Gynecology | Admitting: Obstetrics and Gynecology

## 2019-11-30 ENCOUNTER — Encounter (HOSPITAL_COMMUNITY): Payer: Self-pay | Admitting: Obstetrics and Gynecology

## 2019-11-30 DIAGNOSIS — R109 Unspecified abdominal pain: Secondary | ICD-10-CM | POA: Diagnosis not present

## 2019-11-30 DIAGNOSIS — Z3A35 35 weeks gestation of pregnancy: Secondary | ICD-10-CM | POA: Diagnosis not present

## 2019-11-30 DIAGNOSIS — O26893 Other specified pregnancy related conditions, third trimester: Secondary | ICD-10-CM | POA: Insufficient documentation

## 2019-11-30 DIAGNOSIS — O212 Late vomiting of pregnancy: Secondary | ICD-10-CM | POA: Diagnosis not present

## 2019-11-30 DIAGNOSIS — O4703 False labor before 37 completed weeks of gestation, third trimester: Secondary | ICD-10-CM | POA: Diagnosis not present

## 2019-11-30 DIAGNOSIS — O219 Vomiting of pregnancy, unspecified: Secondary | ICD-10-CM

## 2019-11-30 DIAGNOSIS — Z79899 Other long term (current) drug therapy: Secondary | ICD-10-CM | POA: Diagnosis not present

## 2019-11-30 DIAGNOSIS — O99513 Diseases of the respiratory system complicating pregnancy, third trimester: Secondary | ICD-10-CM | POA: Insufficient documentation

## 2019-11-30 DIAGNOSIS — J45909 Unspecified asthma, uncomplicated: Secondary | ICD-10-CM | POA: Diagnosis not present

## 2019-11-30 DIAGNOSIS — Z3689 Encounter for other specified antenatal screening: Secondary | ICD-10-CM | POA: Insufficient documentation

## 2019-11-30 DIAGNOSIS — Z87891 Personal history of nicotine dependence: Secondary | ICD-10-CM | POA: Diagnosis not present

## 2019-11-30 DIAGNOSIS — O479 False labor, unspecified: Secondary | ICD-10-CM

## 2019-11-30 DIAGNOSIS — R102 Pelvic and perineal pain: Secondary | ICD-10-CM | POA: Insufficient documentation

## 2019-11-30 LAB — URINALYSIS, ROUTINE W REFLEX MICROSCOPIC
Bilirubin Urine: NEGATIVE
Glucose, UA: NEGATIVE mg/dL
Hgb urine dipstick: NEGATIVE
Ketones, ur: NEGATIVE mg/dL
Nitrite: NEGATIVE
Protein, ur: NEGATIVE mg/dL
Specific Gravity, Urine: 1.017 (ref 1.005–1.030)
pH: 6 (ref 5.0–8.0)

## 2019-11-30 MED ORDER — ONDANSETRON 4 MG PO TBDP
8.0000 mg | ORAL_TABLET | Freq: Once | ORAL | Status: AC
  Administered 2019-11-30: 8 mg via ORAL
  Filled 2019-11-30: qty 2

## 2019-11-30 NOTE — MAU Note (Signed)
.   Bailey Baker is a 29 y.o. at [redacted]w[redacted]d here in MAU reporting: lower abdominal pressure, states she vomited earlier today. Denies any VB or LOF  Onset of complaint: today Pain score: 5 Vitals:   11/30/19 1750  BP: 127/75  Pulse: (!) 113  Resp: 16  Temp: 98.2 F (36.8 C)  SpO2: 100%     FHT:135 Lab orders placed from triage: UA

## 2019-11-30 NOTE — MAU Provider Note (Addendum)
History     CSN: [id number]  Arrival date and time: 11/30/19 1727   First Provider Initiated Contact with Patient 11/30/19 1759      Chief Complaint  Patient presents with  . Contractions  . Abdominal Pain   Bailey Baker is a 29 y.o. G2P1 at [redacted]w[redacted]d who presents to MAU with complaints of pelvic pressure. Patient reports vomiting this morning and since then has been having constant pelvic pressure. She reports occasional contractions but is here for the pelvic pressure. Rates pain 8/10- has not taken any medication for pelvic pain or vomiting. She denies vaginal bleeding, discharge or LOF. +FM.    OB History    Gravida  2   Para  1   Term  1   Preterm  0   AB  0   Living  1     SAB  0   TAB  0   Ectopic  0   Multiple  0   Live Births  1           Past Medical History:  Diagnosis Date  . Asthma     Past Surgical History:  Procedure Laterality Date  . NO PAST SURGERIES      Family History  Problem Relation Age of Onset  . Hypertension Mother   . Other Neg Hx     Social History   Tobacco Use  . Smoking status: Former Smoker    Types: Cigarettes    Quit date: 10/19/2016    Years since quitting: 3.1  . Smokeless tobacco: Never Used  Substance Use Topics  . Alcohol use: Yes    Comment: occ  . Drug use: No    Allergies:  Allergies  Allergen Reactions  . Milk-Related Compounds Swelling and Rash    Medications Prior to Admission  Medication Sig Dispense Refill Last Dose  . ferrous sulfate 325 (65 FE) MG EC tablet Take 325 mg by mouth 3 (three) times daily with meals.   11/30/2019 at Unknown time  . Prenatal Vit-Fe Sulfate-FA-DHA (PRENATAL VITAMIN/MIN +DHA) 27-0.8-200 MG CAPS Take 1 capsule by mouth daily.   11/30/2019 at Unknown time  . acetaminophen (TYLENOL) 500 MG tablet Take 500 mg by mouth every 6 (six) hours as needed.   More than a month at Unknown time  . ALBUTEROL IN Inhale into the lungs.   More than a month at Unknown time  .  beclomethasone (QVAR) 80 MCG/ACT inhaler Inhale 2 puffs into the lungs every 4 (four) hours.   More than a month at Unknown time  . omeprazole (PRILOSEC) 20 MG capsule Take 1 capsule (20 mg total) by mouth daily. 30 capsule 0   . ondansetron (ZOFRAN ODT) 8 MG disintegrating tablet Take 1 tablet (8 mg total) by mouth every 8 (eight) hours as needed for nausea or vomiting. 20 tablet 0   . sucralfate (CARAFATE) 1 g tablet Take 1 tablet (1 g total) by mouth 4 (four) times daily -  with meals and at bedtime. 30 tablet 0     Review of Systems  Constitutional: Negative.   Respiratory: Negative.   Cardiovascular: Negative.   Gastrointestinal: Positive for abdominal pain. Negative for constipation, diarrhea, nausea and vomiting.  Genitourinary: Positive for pelvic pain. Negative for difficulty urinating, dysuria, frequency, vaginal bleeding and vaginal discharge.       Pelvic pressure  Musculoskeletal: Negative.   Neurological: Negative.   Psychiatric/Behavioral: Negative.    Physical Exam   Blood pressure 127/75, pulse Marland Kitchen)  113, temperature 98.2 F (36.8 C), resp. rate 16, weight 83.6 kg, last menstrual period 01/20/2019, SpO2 100 %.  Physical Exam  Nursing note and vitals reviewed. Constitutional: She is oriented to person, place, and time. She appears well-developed and well-nourished. No distress.  Cardiovascular: Normal rate and regular rhythm.  Respiratory: Effort normal and breath sounds normal. No respiratory distress. She has no wheezes.  GI: Soft. There is no abdominal tenderness. There is no rebound.  Gravid appropriate for gestational age  Musculoskeletal:        General: No edema. Normal range of motion.  Neurological: She is alert and oriented to person, place, and time.  Skin: Skin is warm and dry.  Psychiatric: She has a normal mood and affect. Her behavior is normal. Thought content normal.   FHR: 140/moderate/+accels/ no decelerations  Toco: UI with irregular UC    Dilation: Closed Effacement (%): 30 Cervical Position: Posterior Station: Ballotable Presentation: Vertex Exam by:: Lanice Shirts CNM   MAU Course  Procedures  MDM Offered zofran ODT for patient's nausea and vomiting, patient accepted medication  Patient declines additional medication  Hydration  Plan to recheck cervix in 1 hour   Reassessment @1920  - patient reports emesis has resolved after medication, reports continued nausea. Patient reports pelvic pressure but denies abdominal contractions.  Cervix rechecked and unchanged  NST reactive and reassuring   Discussed reasons to return to MAU. Follow up as scheduled in the office tomorrow. Return to MAU as needed. Pt stable at time of discharge.   Assessment and Plan   1. Pelvic pressure in pregnancy, antepartum, third trimester   2. Braxton Hicks contractions   3. Nausea and vomiting during pregnancy   4. NST (non-stress test) reactive   5. [redacted] weeks gestation of pregnancy    Discharge home Follow up as scheduled in the office for prenatal care tomorrow  Return to MAU as needed for reasons discussed and/or emergencies  FKC and hydration   Follow-up Information    [facility] Follow up.   Why: Follow up as scheduled for prenatal care tomorrow in the office and return to MAU as needed for reasons discussed or emergencies or labor  Contact information: [address]  [address] [place] [phone]            [phone] CNM 11/30/2019, 8:07 PM

## 2019-11-30 NOTE — MAU Note (Signed)
Was some confusion with security as to visitors being able to switch out. Talked with Security and clarified that in MAU, pt may have only one visitor and the visitor could not switch out with others. The patient understood that and had told her mother not to come back as pt's significant other was on the way. MOther came back anyway and now SO is here as well. Pt voices understanding and apologized that her mother had come back initially

## 2019-12-04 ENCOUNTER — Inpatient Hospital Stay (HOSPITAL_COMMUNITY)
Admission: AD | Admit: 2019-12-04 | Discharge: 2019-12-05 | Disposition: A | Discharge status: Home / Self Care | Payer: Medicaid Other | Attending: Obstetrics and Gynecology | Admitting: Obstetrics and Gynecology

## 2019-12-04 ENCOUNTER — Encounter (HOSPITAL_COMMUNITY): Payer: Self-pay | Admitting: Obstetrics and Gynecology

## 2019-12-04 ENCOUNTER — Other Ambulatory Visit: Payer: Self-pay

## 2019-12-04 DIAGNOSIS — Z3689 Encounter for other specified antenatal screening: Secondary | ICD-10-CM | POA: Insufficient documentation

## 2019-12-04 DIAGNOSIS — O4703 False labor before 37 completed weeks of gestation, third trimester: Secondary | ICD-10-CM | POA: Insufficient documentation

## 2019-12-04 DIAGNOSIS — O479 False labor, unspecified: Secondary | ICD-10-CM

## 2019-12-04 DIAGNOSIS — Z3A36 36 weeks gestation of pregnancy: Secondary | ICD-10-CM | POA: Insufficient documentation

## 2019-12-04 NOTE — MAU Note (Signed)
Pt reports to MAU with contractions that are very painful. Denies VB/LOF, confirms good movement.

## 2019-12-05 DIAGNOSIS — O4703 False labor before 37 completed weeks of gestation, third trimester: Secondary | ICD-10-CM | POA: Diagnosis not present

## 2019-12-05 DIAGNOSIS — Z3A36 36 weeks gestation of pregnancy: Secondary | ICD-10-CM

## 2019-12-05 DIAGNOSIS — Z3689 Encounter for other specified antenatal screening: Secondary | ICD-10-CM | POA: Diagnosis not present

## 2019-12-05 NOTE — Discharge Instructions (Signed)

## 2019-12-05 NOTE — MAU Provider Note (Signed)
S: Ms. Antoniette Peake is a 29 y.o. G2P1001 at [redacted]w[redacted]d  who presents to MAU today for labor evaluation.     Cervical exam by RN:  Dilation: Closed Effacement (%): Thick Cervical Position: Posterior Exam by:: Irish Elders, RN  Fetal Monitoring: Baseline: 140 Variability: moderate Accelerations: present  Decelerations: none  Contractions: irregular contractions   MDM Discussed patient with RN. NST reviewed.  Cervical examination unchanged after 1.5 hour   A: SIUP at [redacted]w[redacted]d  False labor  P: Discharge home Labor precautions and kick counts included in AVS Patient to follow-up with [facility] as scheduled  Patient may return to MAU as needed or when in labor   Sharyon Cable, [place] 12/05/2019 12:03 AM

## 2019-12-05 NOTE — MAU Note (Signed)
I have communicated with Steward Drone, CNM and reviewed vital signs:  Vitals:   12/04/19 2305 12/04/19 2359  BP: 130/74 134/76  Pulse: 94 91  Resp:    Temp:    SpO2: 99%     Vaginal exam:  Dilation: Closed Effacement (%): Thick Cervical Position: Posterior Exam by:: Irish Elders, RN,   Also reviewed contraction pattern and that non-stress test is reactive.  It has been documented that patient is contracting irregularly with no cervical change over 1 hour not indicating active labor.  Patient denies any other complaints.  Based on this report provider has given order for discharge.  A discharge order and diagnosis entered by a provider.   Labor discharge instructions reviewed with patient.

## 2019-12-20 ENCOUNTER — Encounter (HOSPITAL_COMMUNITY): Payer: Self-pay | Admitting: *Deleted

## 2019-12-20 ENCOUNTER — Telehealth (HOSPITAL_COMMUNITY): Payer: Self-pay | Admitting: *Deleted

## 2019-12-20 NOTE — Telephone Encounter (Signed)
Preadmission screen  

## 2019-12-24 ENCOUNTER — Other Ambulatory Visit (HOSPITAL_COMMUNITY)
Admission: RE | Admit: 2019-12-24 | Discharge: 2019-12-24 | Disposition: A | Discharge status: Home / Self Care | Payer: Medicaid Other | Source: Ambulatory Visit | Attending: Obstetrics and Gynecology | Admitting: Obstetrics and Gynecology

## 2019-12-24 DIAGNOSIS — Z20822 Contact with and (suspected) exposure to covid-19: Secondary | ICD-10-CM | POA: Insufficient documentation

## 2019-12-24 DIAGNOSIS — Z01812 Encounter for preprocedural laboratory examination: Secondary | ICD-10-CM | POA: Insufficient documentation

## 2019-12-24 LAB — SARS CORONAVIRUS 2 (TAT 6-24 HRS): SARS Coronavirus 2: NEGATIVE

## 2019-12-26 ENCOUNTER — Other Ambulatory Visit: Payer: Self-pay | Admitting: Obstetrics and Gynecology

## 2019-12-27 ENCOUNTER — Inpatient Hospital Stay (HOSPITAL_COMMUNITY): Payer: Medicaid Other

## 2019-12-27 ENCOUNTER — Encounter (HOSPITAL_COMMUNITY): Payer: Self-pay | Admitting: Obstetrics and Gynecology

## 2019-12-27 ENCOUNTER — Inpatient Hospital Stay (HOSPITAL_COMMUNITY): Payer: Medicaid Other | Admitting: Anesthesiology

## 2019-12-27 ENCOUNTER — Inpatient Hospital Stay (HOSPITAL_COMMUNITY)
Admission: AD | Admit: 2019-12-27 | Discharge: 2019-12-29 | DRG: 807 | Disposition: A | Discharge status: Home / Self Care | Payer: Medicaid Other | Attending: Obstetrics and Gynecology | Admitting: Obstetrics and Gynecology

## 2019-12-27 ENCOUNTER — Other Ambulatory Visit: Payer: Self-pay

## 2019-12-27 DIAGNOSIS — O99824 Streptococcus B carrier state complicating childbirth: Principal | ICD-10-CM | POA: Diagnosis present

## 2019-12-27 DIAGNOSIS — Z3A39 39 weeks gestation of pregnancy: Secondary | ICD-10-CM

## 2019-12-27 DIAGNOSIS — O9952 Diseases of the respiratory system complicating childbirth: Secondary | ICD-10-CM | POA: Diagnosis present

## 2019-12-27 DIAGNOSIS — O99820 Streptococcus B carrier state complicating pregnancy: Secondary | ICD-10-CM

## 2019-12-27 DIAGNOSIS — Z87891 Personal history of nicotine dependence: Secondary | ICD-10-CM

## 2019-12-27 DIAGNOSIS — J45909 Unspecified asthma, uncomplicated: Secondary | ICD-10-CM | POA: Diagnosis present

## 2019-12-27 DIAGNOSIS — O26893 Other specified pregnancy related conditions, third trimester: Secondary | ICD-10-CM | POA: Diagnosis present

## 2019-12-27 LAB — CBC
HCT: 35.3 % — ABNORMAL LOW (ref 36.0–46.0)
Hemoglobin: 11.6 g/dL — ABNORMAL LOW (ref 12.0–15.0)
MCH: 28.9 pg (ref 26.0–34.0)
MCHC: 32.9 g/dL (ref 30.0–36.0)
MCV: 88 fL (ref 80.0–100.0)
Platelets: 277 K/uL (ref 150–400)
RBC: 4.01 MIL/uL (ref 3.87–5.11)
RDW: 13.4 % (ref 11.5–15.5)
WBC: 7.7 K/uL (ref 4.0–10.5)
nRBC: 0 % (ref 0.0–0.2)

## 2019-12-27 LAB — TYPE AND SCREEN
ABO/RH(D): A POS
Antibody Screen: NEGATIVE

## 2019-12-27 MED ORDER — PHENYLEPHRINE 40 MCG/ML (10ML) SYRINGE FOR IV PUSH (FOR BLOOD PRESSURE SUPPORT): 80.0000 ug | PREFILLED_SYRINGE | INTRAVENOUS | Status: DC | PRN

## 2019-12-27 MED ORDER — ONDANSETRON HCL 4 MG/2ML IJ SOLN: 4.0000 mg | Freq: Four times a day (QID) | INTRAMUSCULAR | Status: DC | PRN

## 2019-12-27 MED ORDER — PENICILLIN G POT IN DEXTROSE 60000 UNIT/ML IV SOLN: 3.0000 10*6.[IU] | INTRAVENOUS | Status: DC

## 2019-12-27 MED ORDER — MISOPROSTOL 200 MCG PO TABS
800.0000 ug | ORAL_TABLET | Freq: Once | ORAL | Status: AC
  Administered 2019-12-27: 800 ug via RECTAL

## 2019-12-27 MED ORDER — LIDOCAINE HCL (PF) 1 % IJ SOLN
INTRAMUSCULAR | Status: DC | PRN
  Administered 2019-12-27: 5 mL via EPIDURAL

## 2019-12-27 MED ORDER — MISOPROSTOL 200 MCG PO TABS
ORAL_TABLET | ORAL | Status: AC
  Filled 2019-12-27: qty 4

## 2019-12-27 MED ORDER — BENZOCAINE-MENTHOL 20-0.5 % EX AERO
1.0000 "application " | INHALATION_SPRAY | CUTANEOUS | Status: DC | PRN
  Administered 2019-12-28: 1 via TOPICAL
  Filled 2019-12-27: qty 56

## 2019-12-27 MED ORDER — BUDESONIDE 0.25 MG/2ML IN SUSP
0.2500 mg | Freq: Two times a day (BID) | RESPIRATORY_TRACT | Status: DC
  Administered 2019-12-28 – 2019-12-29 (×3): 0.25 mg via RESPIRATORY_TRACT
  Filled 2019-12-27 (×3): qty 2

## 2019-12-27 MED ORDER — SENNOSIDES-DOCUSATE SODIUM 8.6-50 MG PO TABS
2.0000 | ORAL_TABLET | ORAL | Status: DC
  Administered 2019-12-28 – 2019-12-29 (×2): 2 via ORAL
  Filled 2019-12-27 (×2): qty 2

## 2019-12-27 MED ORDER — LIDOCAINE HCL (PF) 1 % IJ SOLN: 30.0000 mL | INTRAMUSCULAR | Status: DC | PRN

## 2019-12-27 MED ORDER — EPHEDRINE 5 MG/ML INJ: 10.0000 mg | INTRAVENOUS | Status: DC | PRN

## 2019-12-27 MED ORDER — LACTATED RINGERS IV SOLN: INTRAVENOUS | Status: DC

## 2019-12-27 MED ORDER — LACTATED RINGERS IV SOLN
500.0000 mL | Freq: Once | INTRAVENOUS | Status: AC
  Administered 2019-12-27: 500 mL via INTRAVENOUS

## 2019-12-27 MED ORDER — ONDANSETRON HCL 4 MG/2ML IJ SOLN: 4.0000 mg | INTRAMUSCULAR | Status: DC | PRN

## 2019-12-27 MED ORDER — PRENATAL MULTIVITAMIN CH
1.0000 | ORAL_TABLET | Freq: Every day | ORAL | Status: DC
  Administered 2019-12-28: 1 via ORAL
  Filled 2019-12-27: qty 1

## 2019-12-27 MED ORDER — IBUPROFEN 600 MG PO TABS
600.0000 mg | ORAL_TABLET | Freq: Four times a day (QID) | ORAL | Status: DC
  Administered 2019-12-27 – 2019-12-29 (×6): 600 mg via ORAL
  Filled 2019-12-27 (×6): qty 1

## 2019-12-27 MED ORDER — TETANUS-DIPHTH-ACELL PERTUSSIS 5-2.5-18.5 LF-MCG/0.5 IM SUSP: 0.5000 mL | Freq: Once | INTRAMUSCULAR | Status: DC

## 2019-12-27 MED ORDER — WITCH HAZEL-GLYCERIN EX PADS: 1.0000 "application " | MEDICATED_PAD | CUTANEOUS | Status: DC | PRN

## 2019-12-27 MED ORDER — ONDANSETRON HCL 4 MG PO TABS: 4.0000 mg | ORAL_TABLET | ORAL | Status: DC | PRN

## 2019-12-27 MED ORDER — DIPHENHYDRAMINE HCL 25 MG PO CAPS: 25.0000 mg | ORAL_CAPSULE | Freq: Four times a day (QID) | ORAL | Status: DC | PRN

## 2019-12-27 MED ORDER — LACTATED RINGERS IV SOLN
500.0000 mL | INTRAVENOUS | Status: DC | PRN
  Administered 2019-12-27: 500 mL via INTRAVENOUS

## 2019-12-27 MED ORDER — SOD CITRATE-CITRIC ACID 500-334 MG/5ML PO SOLN: 30.0000 mL | ORAL | Status: DC | PRN

## 2019-12-27 MED ORDER — DIPHENHYDRAMINE HCL 50 MG/ML IJ SOLN: 12.5000 mg | INTRAMUSCULAR | Status: DC | PRN

## 2019-12-27 MED ORDER — OXYTOCIN BOLUS FROM INFUSION
500.0000 mL | Freq: Once | INTRAVENOUS | Status: AC
  Administered 2019-12-27: 500 mL via INTRAVENOUS

## 2019-12-27 MED ORDER — DIBUCAINE (PERIANAL) 1 % EX OINT: 1.0000 "application " | TOPICAL_OINTMENT | CUTANEOUS | Status: DC | PRN

## 2019-12-27 MED ORDER — BUTORPHANOL TARTRATE 1 MG/ML IJ SOLN: 1.0000 mg | INTRAMUSCULAR | Status: DC | PRN

## 2019-12-27 MED ORDER — COCONUT OIL OIL: 1.0000 "application " | TOPICAL_OIL | Status: DC | PRN

## 2019-12-27 MED ORDER — OXYTOCIN-SODIUM CHLORIDE 30-0.9 UT/500ML-% IV SOLN
2.5000 [IU]/h | INTRAVENOUS | Status: DC
  Administered 2019-12-27: 2.5 [IU]/h via INTRAVENOUS
  Filled 2019-12-27 (×2): qty 500

## 2019-12-27 MED ORDER — FLEET ENEMA 7-19 GM/118ML RE ENEM: 1.0000 | ENEMA | RECTAL | Status: DC | PRN

## 2019-12-27 MED ORDER — ACETAMINOPHEN 325 MG PO TABS
650.0000 mg | ORAL_TABLET | ORAL | Status: DC | PRN
  Administered 2019-12-28: 650 mg via ORAL
  Filled 2019-12-27: qty 2

## 2019-12-27 MED ORDER — SODIUM CHLORIDE (PF) 0.9 % IJ SOLN
INTRAMUSCULAR | Status: DC | PRN
  Administered 2019-12-27: 12 mL/h via EPIDURAL

## 2019-12-27 MED ORDER — OXYTOCIN-SODIUM CHLORIDE 30-0.9 UT/500ML-% IV SOLN: 1.0000 m[IU]/min | INTRAVENOUS | Status: DC

## 2019-12-27 MED ORDER — ACETAMINOPHEN 325 MG PO TABS: 650.0000 mg | ORAL_TABLET | ORAL | Status: DC | PRN

## 2019-12-27 MED ORDER — SODIUM CHLORIDE 0.9 % IV SOLN
5.0000 10*6.[IU] | Freq: Once | INTRAVENOUS | Status: AC
  Administered 2019-12-27: 5 10*6.[IU] via INTRAVENOUS
  Filled 2019-12-27: qty 5

## 2019-12-27 MED ORDER — TERBUTALINE SULFATE 1 MG/ML IJ SOLN: 0.2500 mg | Freq: Once | INTRAMUSCULAR | Status: DC | PRN

## 2019-12-27 MED ORDER — FENTANYL-BUPIVACAINE-NACL 0.5-0.125-0.9 MG/250ML-% EP SOLN
12.0000 mL/h | EPIDURAL | Status: DC | PRN
  Filled 2019-12-27: qty 250

## 2019-12-27 MED ORDER — ZOLPIDEM TARTRATE 5 MG PO TABS: 5.0000 mg | ORAL_TABLET | Freq: Every evening | ORAL | Status: DC | PRN

## 2019-12-27 MED ORDER — OXYCODONE-ACETAMINOPHEN 5-325 MG PO TABS: 2.0000 | ORAL_TABLET | ORAL | Status: DC | PRN

## 2019-12-27 MED ORDER — MISOPROSTOL 25 MCG QUARTER TABLET
25.0000 ug | ORAL_TABLET | ORAL | Status: DC | PRN
  Administered 2019-12-27: 25 ug via VAGINAL
  Filled 2019-12-27: qty 1

## 2019-12-27 MED ORDER — SIMETHICONE 80 MG PO CHEW: 80.0000 mg | CHEWABLE_TABLET | ORAL | Status: DC | PRN

## 2019-12-27 MED ORDER — OXYCODONE-ACETAMINOPHEN 5-325 MG PO TABS: 1.0000 | ORAL_TABLET | ORAL | Status: DC | PRN

## 2019-12-27 NOTE — H&P (Signed)
Bailey Baker is a 29 y.o. female presenting for scheduled IOL. +FM, denies VB, LOF, has occ ctx.  PNC s/f asthma, +chlamydia in 1st trimester with neg test of cure.  OB History    Gravida  2   Para  1   Term  1   Preterm  0   AB  0   Living  1     SAB  0   TAB  0   Ectopic  0   Multiple  0   Live Births  1          Past Medical History:  Diagnosis Date  . Asthma    Past Surgical History:  Procedure Laterality Date  . NO PAST SURGERIES     Family History: family history includes Hypertension in her mother. Social History:  reports that she quit smoking about 3 years ago. Her smoking use included cigarettes. She has never used smokeless tobacco. She reports previous alcohol use. She reports that she does not use drugs.     Maternal Diabetes: No 1hr 117 Genetic Screening: Declined Maternal Ultrasounds/Referrals: Normal Fetal Ultrasounds or other Referrals:  None Maternal Substance Abuse:  No Significant Maternal Medications:  None Significant Maternal Lab Results:  Group B Strep positive Other Comments:  None  Review of Systems  Constitutional: Negative for chills and fever.  Respiratory: Negative for shortness of breath.   Cardiovascular: Negative for chest pain, palpitations and leg swelling.  Gastrointestinal: Negative for abdominal pain, nausea and vomiting.  Neurological: Negative for dizziness, weakness and headaches.  Psychiatric/Behavioral: Negative for suicidal ideas.   Maternal Medical History:  Reason for admission: Nausea.      Last menstrual period 01/20/2019. Exam Physical Exam  Constitutional: She is oriented to person, place, and time. She appears well-developed and well-nourished. No distress.  HENT:  Head: Normocephalic and atraumatic.  Eyes: Pupils are equal, round, and reactive to light.  Cardiovascular: Normal rate and regular rhythm. Exam reveals no gallop.  No murmur heard. GI: There is no abdominal tenderness. There is  no rebound and no guarding.  Genitourinary:    Vagina and uterus normal.   Musculoskeletal:        General: Normal range of motion.     Cervical back: Normal range of motion and neck supple.  Neurological: She is alert and oriented to person, place, and time.  Skin: Skin is warm and dry.    Prenatal labs: ABO, Rh: --/--/A POS (06/08 1330) Antibody: NEG (06/08 1330) Rubella: Immune (12/03 0000) RPR: Nonreactive (12/03 0000)  HBsAg: Negative (12/03 0000)  HIV: Non-reactive (12/03 0000)  GBS:   pos Assessment/Plan: This is a 29yo G2P1001 @ 39 4/7 by LMP c/w TVUS admitted for IOL af term. GBS pos, no PCN allergy  Category 1 tracing, baseline 130, +accels, no decels CE 2/30/-3  PV cytotec at this time, AROM and pitocin when amenable  Carlisle Cater 12/27/2019, 2:37 PM

## 2019-12-27 NOTE — Anesthesia Procedure Notes (Signed)
Epidural Patient location during procedure: OB Start time: 12/27/2019 5:33 PM End time: 12/27/2019 5:45 PM  Staffing Anesthesiologist: Trevor Iha, MD Performed: anesthesiologist   Preanesthetic Checklist Completed: patient identified, IV checked, site marked, risks and benefits discussed, surgical consent, monitors and equipment checked, pre-op evaluation and timeout performed  Epidural Patient position: sitting Prep: DuraPrep and site prepped and draped Patient monitoring: continuous pulse ox and blood pressure Approach: midline Location: L3-L4 Injection technique: LOR air  Needle:  Needle type: Tuohy  Needle gauge: 17 G Needle length: 9 cm and 9 Needle insertion depth: 5 cm cm Catheter type: closed end flexible Catheter size: 19 Gauge Catheter at skin depth: 10 cm Test dose: negative  Assessment Events: blood not aspirated, injection not painful, no injection resistance, no paresthesia and negative IV test  Additional Notes Patient identified. Risks/Benefits/Options discussed with patient including but not limited to bleeding, infection, nerve damage, paralysis, failed block, incomplete pain control, headache, blood pressure changes, nausea, vomiting, reactions to medication both or allergic, itching and postpartum back pain. Confirmed with bedside nurse the patient's most recent platelet count. Confirmed with patient that they are not currently taking any anticoagulation, have any bleeding history or any family history of bleeding disorders. Patient expressed understanding and wished to proceed. All questions were answered. Sterile technique was used throughout the entire procedure. Please see nursing notes for vital signs. Test dose was given through epidural needle and negative prior to continuing to dose epidural or start infusion. Warning signs of high block given to the patient including shortness of breath, tingling/numbness in hands, complete motor block, or any  concerning symptoms with instructions to call for help. Patient was given instructions on fall risk and not to get out of bed. All questions and concerns addressed with instructions to call with any issues.  Attempt (S) . Patient tolerated procedure well.

## 2019-12-27 NOTE — Anesthesia Preprocedure Evaluation (Signed)
Anesthesia Evaluation  Patient identified by MRN, date of birth, ID band Patient awake    Reviewed: Allergy & Precautions, NPO status , Patient's Chart, lab work & pertinent test results  Airway Mallampati: II  TM Distance: >3 FB Neck ROM: Full    Dental no notable dental hx. (+) Teeth Intact   Pulmonary asthma , former smoker,    Pulmonary exam normal breath sounds clear to auscultation       Cardiovascular Exercise Tolerance: Good negative cardio ROS Normal cardiovascular exam Rhythm:Regular Rate:Normal     Neuro/Psych negative neurological ROS  negative psych ROS   GI/Hepatic negative GI ROS, Neg liver ROS,   Endo/Other  negative endocrine ROS  Renal/GU negative Renal ROS     Musculoskeletal negative musculoskeletal ROS (+)   Abdominal   Peds  Hematology Lab Results      Component                Value               Date                      WBC                      7.7                 12/27/2019                HGB                      11.6 (L)            12/27/2019                HCT                      35.3 (L)            12/27/2019                MCV                      88.0                12/27/2019                PLT                      277                 12/27/2019             Anesthesia Other Findings   Reproductive/Obstetrics (+) Pregnancy                             Anesthesia Physical Anesthesia Plan  ASA: II  Anesthesia Plan: Epidural   Post-op Pain Management:    Induction:   PONV Risk Score and Plan:   Airway Management Planned:   Additional Equipment:   Intra-op Plan:   Post-operative Plan:   Informed Consent: I have reviewed the patients History and Physical, chart, labs and discussed the procedure including the risks, benefits and alternatives for the proposed anesthesia with the patient or authorized representative who has indicated his/her  understanding and acceptance.       Plan Discussed with:   Anesthesia Plan Comments: (  39.4 wk G2P1 for LEA hx of asthma)        Anesthesia Quick Evaluation

## 2019-12-28 LAB — RPR
RPR Ser Ql: REACTIVE — AB
RPR Titer: 1:1 {titer}

## 2019-12-28 LAB — CBC
HCT: 32.4 % — ABNORMAL LOW (ref 36.0–46.0)
Hemoglobin: 10.9 g/dL — ABNORMAL LOW (ref 12.0–15.0)
MCH: 29.1 pg (ref 26.0–34.0)
MCHC: 33.6 g/dL (ref 30.0–36.0)
MCV: 86.6 fL (ref 80.0–100.0)
Platelets: 257 10*3/uL (ref 150–400)
RBC: 3.74 MIL/uL — ABNORMAL LOW (ref 3.87–5.11)
RDW: 13.2 % (ref 11.5–15.5)
WBC: 11.9 10*3/uL — ABNORMAL HIGH (ref 4.0–10.5)
nRBC: 0 % (ref 0.0–0.2)

## 2019-12-28 LAB — T.PALLIDUM AB, TOTAL: T Pallidum Abs: NONREACTIVE

## 2019-12-28 NOTE — Anesthesia Postprocedure Evaluation (Signed)
Anesthesia Post Note  Patient: Bailey Baker  Procedure(s) Performed: AN AD HOC LABOR EPIDURAL     Patient location during evaluation: Mother Baby Anesthesia Type: Epidural Level of consciousness: awake and alert Pain management: pain level controlled Vital Signs Assessment: post-procedure vital signs reviewed and stable Respiratory status: spontaneous breathing, nonlabored ventilation and respiratory function stable Cardiovascular status: stable Postop Assessment: no headache, no backache and epidural receding Anesthetic complications: no    Last Vitals:  Vitals:   12/28/19 0700 12/28/19 1030  BP:  118/74  Pulse:  85  Resp:  20  Temp:  36.4 C  SpO2: 99% 100%    Last Pain:  Vitals:   12/28/19 1223  TempSrc:   PainSc: 5    Pain Goal: Patients Stated Pain Goal: 8 (12/27/19 1520)                 Minerva Areola

## 2019-12-28 NOTE — Progress Notes (Signed)
Post Partum Day 1 Subjective: no complaints, up ad lib, voiding, tolerating PO and nl lochia, pain controlled  Objective: Blood pressure 116/73, pulse 85, temperature 97.8 F (36.6 C), temperature source Oral, resp. rate 20, height 5' 2.5" (1.588 m), weight 83 kg, last menstrual period 01/20/2019, SpO2 100 %, unknown if currently breastfeeding.  Physical Exam:  General: alert and no distress Lochia: appropriate Uterine Fundus: firm  Recent Labs    12/27/19 1348 12/28/19 0624  HGB 11.6* 10.9*  HCT 35.3* 32.4*    Assessment/Plan: Plan for discharge tomorrow, Breastfeeding and Lactation consult.  Routine PP care.     LOS: 1 day   Bailey Baker 12/28/2019, 7:48 AM

## 2019-12-28 NOTE — Lactation Note (Signed)
This note was copied from a baby's chart. Lactation Consultation Note  Patient Name: Bailey Baker [id number] Date: 12/28/2019 Reason for consult: Initial assessment;Infant < 6lbs   P2, Baby 17 hours old.  < 6 lbs. Mother breastfed her first child for 4-5 mos. Infant latched in cradle hold upon entering with intermittent swallows. Discussed latching in cross cradle hold for increased head support. Set up DEBP and recommend mother pump after every other feeding unless baby begins to tire and then mother will increase pumping. Fitted mother and it seems 24 flanges fit well. Mother knows to adjust size to 27 if needed. Discussed pumping frequency, milk storage and cleaning. and giving volume back to baby on spoon. Feed on demand with cues at least q 3 hours.  Goal 8-12+ times per day after first 24 hrs.  Place baby STS if not cueing.  Mom made aware of O/P services, breastfeeding support groups, community resources, and our phone # for post-discharge questions.        Maternal Data Does the patient have breastfeeding experience prior to this delivery?: Yes  Feeding Feeding Type: Breast Fed  LATCH Score Latch: Grasps breast easily, tongue down, lips flanged, rhythmical sucking.  Audible Swallowing: A few with stimulation  Type of Nipple: Everted at rest and after stimulation  Comfort (Breast/Nipple): Soft / non-tender  Hold (Positioning): Assistance needed to correctly position infant at breast and maintain latch.  LATCH Score: 8  Interventions Interventions: Breast feeding basics reviewed;Skin to skin;DEBP  Lactation Tools Discussed/Used Pump Review: Setup, frequency, and cleaning;Milk Storage Initiated by:: Dahlia Byes RN IBCLC Date initiated:: 12/28/19   Consult Status Consult Status: Follow-up Date: 12/29/19 Follow-up type: In-patient    Dahlia Byes [facility] 12/28/2019, 11:35 AM

## 2019-12-29 MED ORDER — IBUPROFEN 600 MG PO TABS: 600.0000 mg | ORAL_TABLET | Freq: Four times a day (QID) | ORAL | 1 refills | Status: DC | PRN

## 2019-12-29 NOTE — Discharge Instructions (Signed)
Call office with any complaints [phone] 

## 2019-12-29 NOTE — Discharge Summary (Signed)
Postpartum Discharge Summary  Date of Service updated      Patient Name: Bailey Baker DOB: 03-24-1991 MRN: [id number]  Date of admission: 12/27/2019 Delivery date:12/27/2019  Delivering provider: Deliah Boston  Date of discharge: 12/29/2019  Admitting diagnosis: [redacted] weeks gestation of pregnancy [Z3A.39] Intrauterine pregnancy: [redacted]w[redacted]d    Secondary diagnosis:  Active Problems:   [redacted] weeks gestation of pregnancy  Additional problems: none    Discharge diagnosis: Term Pregnancy Delivered                                              Post partum procedures:n/a Augmentation: AROM, Pitocin and Cytotec Complications: None  Hospital course: Induction of Labor With Vaginal Delivery   29y.o. yo [id number] admitted to the hospital 12/27/2019 for induction of labor.  Indication for induction: Favorable cervix at term.  Patient had an uncomplicated labor course as follows: Membrane Rupture Time/Date: 5:16 PM ,12/27/2019   Delivery Method:Vaginal, Spontaneous  Episiotomy: None  Lacerations:  2nd degree;Perineal  Details of delivery can be found in separate delivery note.  Patient had a routine postpartum course. Patient is discharged home 12/29/19.  Newborn Data: Birth date:12/27/2019  Birth time:6:30 PM  Gender:Female  Living status:Living  Apgars:9 ,9  Weight:2489 g   Magnesium Sulfate received: No BMZ received: No Rhophylac:N/A MMR:N/A T-DaP:Given prenatally Flu: N/A Transfusion:No  Physical exam  Vitals:   12/28/19 1443 12/28/19 2012 12/28/19 2104 12/29/19 0509  BP: (!) 105/59  118/78 120/82  Pulse: 79  88 77  Resp: '18  16 20  '$ Temp: 97.6 F (36.4 C)  98.2 F (36.8 C) 97.6 F (36.4 C)  TempSrc: Oral  Oral Oral  SpO2:  100%  99%  Weight:      Height:       General: alert, cooperative and no distress Lochia: appropriate Uterine Fundus: firm Incision: N/A DVT Evaluation: No evidence of DVT seen on physical exam. Labs: Lab Results  Component Value Date    WBC 11.9 (H) 12/28/2019   HGB 10.9 (L) 12/28/2019   HCT 32.4 (L) 12/28/2019   MCV 86.6 12/28/2019   PLT 257 12/28/2019   CMP Latest Ref Rng & Units 03/13/2019  Glucose 70 - 99 mg/dL 98  BUN 6 - 20 mg/dL 9  Creatinine 0.44 - 1.00 mg/dL 0.93  Sodium 135 - 145 mmol/L 141  Potassium 3.5 - 5.1 mmol/L 3.7  Chloride 98 - 111 mmol/L 108  CO2 22 - 32 mmol/L 25  Calcium 8.9 - 10.3 mg/dL 9.0  Total Protein 6.5 - 8.1 g/dL 7.3  Total Bilirubin 0.3 - 1.2 mg/dL 0.4  Alkaline Phos 38 - 126 U/L 67  AST 15 - 41 U/L 16  ALT 0 - 44 U/L 15   Edinburgh Score: Edinburgh Postnatal Depression Scale Screening Tool 12/29/2019  I have been able to laugh and see the funny side of things. 0  I have looked forward with enjoyment to things. 0  I have blamed myself unnecessarily when things went wrong. 0  I have been anxious or worried for no good reason. 0  I have felt scared or panicky for no good reason. 0  Things have been getting on top of me. 0  I have been so unhappy that I have had difficulty sleeping. 0  I have felt sad or miserable. 0  I  have been so unhappy that I have been crying. 0  The thought of harming myself has occurred to me. 0  Edinburgh Postnatal Depression Scale Total 0      After visit meds:     Discharge home in stable condition Infant Feeding: Breast Infant Disposition:home with mother Discharge instruction: per After Visit Summary and Postpartum booklet. Activity: Advance as tolerated. Pelvic rest for 6 weeks.  Diet: routine diet Anticipated Birth Control: Unsure Postpartum Appointment:6 weeks Additional Postpartum F/U: n/a Future Appointments:No future appointments. Follow up Visit:  Follow-up Information    Shivaji, Melida Quitter, MD. Call in 6 week(s).   Specialty: Obstetrics and Gynecology Why: For postpartum visit Contact information: [address] [place] [address] [phone]                   12/29/2019 Isaiah Serge, DO

## 2019-12-29 NOTE — Lactation Note (Signed)
This note was copied from a baby's chart. Lactation Consultation Note  Patient Name: Bailey Baker [id number] Date: 12/29/2019 Reason for consult: Follow-up assessment   Baby 41 hours old.  < 6 lbs. Mother denies questions or concerns. Feed on demand with cues.  Goal 8-12+ times per day after first 24 hrs.  Place baby STS if not cueing.  Wake baby after 3-4 hours if needed.   Mother has DEBP and knows to pump if baby is sleepy at the breast.  Reviewed engorgement care and monitoring voids/stools.    Maternal Data    Feeding Feeding Type: Breast Fed  LATCH Score                   Interventions Interventions: Breast feeding basics reviewed;DEBP  Lactation Tools Discussed/Used     Consult Status Consult Status: Complete Date: 12/29/19    Dahlia Byes [facility] 12/29/2019, 11:48 AM

## 2019-12-29 NOTE — Progress Notes (Signed)
Post Partum Day 2 Subjective: no complaints, up ad lib, voiding, tolerating PO, + flatus and lochia mild. Pt denies HA, Blurry vision, SOB or CP. She is bonding well with baby. She would like discharge to home today - pain well controlled.  Objective: Blood pressure 120/82, pulse 77, temperature 97.6 F (36.4 C), temperature source Oral, resp. rate 20, height 5' 2.5" (1.588 m), weight 83 kg, last menstrual period 01/20/2019, SpO2 99 %, unknown if currently breastfeeding.  Physical Exam:  General: alert, cooperative and no distress Lochia: appropriate Uterine Fundus: firm Incision: n/a DVT Evaluation: No evidence of DVT seen on physical exam.  Recent Labs    12/27/19 1348 12/28/19 0624  HGB 11.6* 10.9*  HCT 35.3* 32.4*    Assessment/Plan: Discharge home and Breastfeeding  Instructions reviewed   LOS: 2 days   Dhruvan Gullion W Cara Aguino 12/29/2019, 9:41 AM

## 2021-01-01 ENCOUNTER — Ambulatory Visit: Admit: 2021-01-01 | Discharge status: Against Medical Advice | Payer: Medicaid Other

## 2021-07-03 ENCOUNTER — Other Ambulatory Visit: Payer: Self-pay

## 2021-07-03 ENCOUNTER — Encounter (HOSPITAL_BASED_OUTPATIENT_CLINIC_OR_DEPARTMENT_OTHER): Payer: Self-pay | Admitting: Obstetrics and Gynecology

## 2021-07-03 NOTE — Progress Notes (Signed)
Contacted Hannah at [facility] office about not being able to contact patient. Dahlia Client to email patient and give her my number to call to complete preop health history.

## 2021-07-03 NOTE — Progress Notes (Signed)
Spoke w/ via phone for pre-op interview--- [employer]----   UPT, T&S, RPR            Lab results------ COVID test -----patient states asymptomatic no test needed Arrive at -------0530 NPO after MN NO Solid Food.  Clear liquids from MN until--- Med rec completed Medications to take morning of surgery -----NONE Diabetic medication ----- Patient instructed no nail polish to be worn day of surgery Patient instructed to bring photo id and insurance card day of surgery Patient aware to have Driver (ride ) / caregiver    for 24 hours after surgery  Patient Special Instructions ----- Pre-Op special Istructions ----- Patient verbalized understanding of instructions that were given at this phone interview. Patient denies shortness of breath, chest pain, fever, cough at this phone interview.

## 2021-07-04 NOTE — Anesthesia Preprocedure Evaluation (Addendum)
Anesthesia Evaluation  Patient identified by MRN, date of birth, ID band Patient awake    Reviewed: Allergy & Precautions, H&P , NPO status , Patient's Chart, lab work & pertinent test results  Airway Mallampati: II  TM Distance: >3 FB Neck ROM: Full    Dental no notable dental hx. (+) Teeth Intact, Dental Advisory Given   Pulmonary asthma , former smoker,    Pulmonary exam normal breath sounds clear to auscultation       Cardiovascular Exercise Tolerance: Good negative cardio ROS   Rhythm:Regular Rate:Normal     Neuro/Psych negative neurological ROS  negative psych ROS   GI/Hepatic negative GI ROS, Neg liver ROS,   Endo/Other  Morbid obesity  Renal/GU negative Renal ROS  negative genitourinary   Musculoskeletal   Abdominal   Peds  Hematology negative hematology ROS (+)   Anesthesia Other Findings   Reproductive/Obstetrics negative OB ROS                           Anesthesia Physical Anesthesia Plan  ASA: 2  Anesthesia Plan: General   Post-op Pain Management: Tylenol PO (pre-op) and Toradol IV (intra-op)   Induction: Intravenous  PONV Risk Score and Plan: 4 or greater and Ondansetron, Dexamethasone and Midazolam  Airway Management Planned: LMA  Additional Equipment:   Intra-op Plan:   Post-operative Plan: Extubation in OR  Informed Consent: I have reviewed the patients History and Physical, chart, labs and discussed the procedure including the risks, benefits and alternatives for the proposed anesthesia with the patient or authorized representative who has indicated his/her understanding and acceptance.     Dental advisory given  Plan Discussed with: CRNA and Surgeon  Anesthesia Plan Comments:        Anesthesia Quick Evaluation

## 2021-07-05 ENCOUNTER — Ambulatory Visit (HOSPITAL_BASED_OUTPATIENT_CLINIC_OR_DEPARTMENT_OTHER): Payer: Medicaid Other | Admitting: Anesthesiology

## 2021-07-05 ENCOUNTER — Encounter (HOSPITAL_BASED_OUTPATIENT_CLINIC_OR_DEPARTMENT_OTHER)
Admission: RE | Disposition: A | Discharge status: Home / Self Care | Payer: Self-pay | Source: Home / Self Care | Attending: Obstetrics and Gynecology

## 2021-07-05 ENCOUNTER — Encounter (HOSPITAL_BASED_OUTPATIENT_CLINIC_OR_DEPARTMENT_OTHER): Payer: Self-pay | Admitting: Obstetrics and Gynecology

## 2021-07-05 ENCOUNTER — Ambulatory Visit (HOSPITAL_BASED_OUTPATIENT_CLINIC_OR_DEPARTMENT_OTHER)
Admission: RE | Admit: 2021-07-05 | Discharge: 2021-07-05 | Disposition: A | Discharge status: Home / Self Care | Payer: Medicaid Other | Attending: Obstetrics and Gynecology | Admitting: Obstetrics and Gynecology

## 2021-07-05 DIAGNOSIS — Z6836 Body mass index (BMI) 36.0-36.9, adult: Secondary | ICD-10-CM | POA: Diagnosis not present

## 2021-07-05 DIAGNOSIS — N840 Polyp of corpus uteri: Secondary | ICD-10-CM | POA: Insufficient documentation

## 2021-07-05 DIAGNOSIS — J45909 Unspecified asthma, uncomplicated: Secondary | ICD-10-CM | POA: Diagnosis not present

## 2021-07-05 DIAGNOSIS — Z87891 Personal history of nicotine dependence: Secondary | ICD-10-CM | POA: Diagnosis not present

## 2021-07-05 DIAGNOSIS — N939 Abnormal uterine and vaginal bleeding, unspecified: Secondary | ICD-10-CM | POA: Diagnosis present

## 2021-07-05 HISTORY — PX: DILATATION & CURETTAGE/HYSTEROSCOPY WITH MYOSURE: SHX6511

## 2021-07-05 LAB — POCT PREGNANCY, URINE: Preg Test, Ur: NEGATIVE

## 2021-07-05 LAB — TYPE AND SCREEN
ABO/RH(D): A POS
Antibody Screen: NEGATIVE

## 2021-07-05 SURGERY — DILATATION & CURETTAGE/HYSTEROSCOPY WITH MYOSURE
Anesthesia: General

## 2021-07-05 MED ORDER — KETOROLAC TROMETHAMINE 30 MG/ML IJ SOLN
30.0000 mg | Freq: Once | INTRAMUSCULAR | Status: AC
  Administered 2021-07-05: 30 mg via INTRAVENOUS

## 2021-07-05 MED ORDER — POVIDONE-IODINE 10 % EX SWAB: 2.0000 "application " | Freq: Once | CUTANEOUS | Status: DC

## 2021-07-05 MED ORDER — ACETAMINOPHEN 500 MG PO TABS
ORAL_TABLET | ORAL | Status: AC
  Filled 2021-07-05: qty 2

## 2021-07-05 MED ORDER — LACTATED RINGERS IV SOLN: INTRAVENOUS | Status: DC

## 2021-07-05 MED ORDER — LIDOCAINE HCL 1 % IJ SOLN
INTRAMUSCULAR | Status: DC | PRN
  Administered 2021-07-05: 10 mL

## 2021-07-05 MED ORDER — DEXAMETHASONE SODIUM PHOSPHATE 10 MG/ML IJ SOLN
INTRAMUSCULAR | Status: DC | PRN
Start: 2021-07-05 — End: 2021-07-05
  Administered 2021-07-05: 10 mg via INTRAVENOUS

## 2021-07-05 MED ORDER — FENTANYL CITRATE (PF) 100 MCG/2ML IJ SOLN: 25.0000 ug | INTRAMUSCULAR | Status: DC | PRN

## 2021-07-05 MED ORDER — PROPOFOL 10 MG/ML IV BOLUS
INTRAVENOUS | Status: DC | PRN
  Administered 2021-07-05: 200 mg via INTRAVENOUS

## 2021-07-05 MED ORDER — FENTANYL CITRATE (PF) 100 MCG/2ML IJ SOLN
INTRAMUSCULAR | Status: AC
  Filled 2021-07-05: qty 2

## 2021-07-05 MED ORDER — WHITE PETROLATUM EX OINT
TOPICAL_OINTMENT | CUTANEOUS | Status: AC
  Filled 2021-07-05: qty 5

## 2021-07-05 MED ORDER — ACETAMINOPHEN 500 MG PO TABS
1000.0000 mg | ORAL_TABLET | Freq: Once | ORAL | Status: AC
  Administered 2021-07-05: 1000 mg via ORAL

## 2021-07-05 MED ORDER — SODIUM CHLORIDE 0.9 % IR SOLN
Status: DC | PRN
  Administered 2021-07-05: 3000 mL

## 2021-07-05 MED ORDER — PROPOFOL 10 MG/ML IV BOLUS
INTRAVENOUS | Status: AC
  Filled 2021-07-05: qty 20

## 2021-07-05 MED ORDER — ONDANSETRON HCL 4 MG/2ML IJ SOLN
INTRAMUSCULAR | Status: AC
  Filled 2021-07-05: qty 2

## 2021-07-05 MED ORDER — FENTANYL CITRATE (PF) 100 MCG/2ML IJ SOLN
INTRAMUSCULAR | Status: DC | PRN
  Administered 2021-07-05 (×2): 50 ug via INTRAVENOUS

## 2021-07-05 MED ORDER — IBUPROFEN 800 MG PO TABS: 800.0000 mg | ORAL_TABLET | Freq: Three times a day (TID) | ORAL | 0 refills | Status: DC | PRN

## 2021-07-05 MED ORDER — DEXAMETHASONE SODIUM PHOSPHATE 10 MG/ML IJ SOLN
INTRAMUSCULAR | Status: AC
  Filled 2021-07-05: qty 1

## 2021-07-05 MED ORDER — LIDOCAINE 2% (20 MG/ML) 5 ML SYRINGE
INTRAMUSCULAR | Status: DC | PRN
  Administered 2021-07-05: 60 mg via INTRAVENOUS

## 2021-07-05 MED ORDER — ONDANSETRON HCL 4 MG/2ML IJ SOLN
INTRAMUSCULAR | Status: DC | PRN
  Administered 2021-07-05: 4 mg via INTRAVENOUS

## 2021-07-05 MED ORDER — MIDAZOLAM HCL 2 MG/2ML IJ SOLN
INTRAMUSCULAR | Status: AC
  Filled 2021-07-05: qty 2

## 2021-07-05 MED ORDER — LIDOCAINE 2% (20 MG/ML) 5 ML SYRINGE
INTRAMUSCULAR | Status: AC
  Filled 2021-07-05: qty 5

## 2021-07-05 MED ORDER — KETOROLAC TROMETHAMINE 30 MG/ML IJ SOLN
INTRAMUSCULAR | Status: AC
  Filled 2021-07-05: qty 1

## 2021-07-05 MED ORDER — MIDAZOLAM HCL 5 MG/5ML IJ SOLN
INTRAMUSCULAR | Status: DC | PRN
  Administered 2021-07-05: 2 mg via INTRAVENOUS

## 2021-07-05 SURGICAL SUPPLY — 14 items
DEVICE MYOSURE LITE (MISCELLANEOUS) ×2 IMPLANT
DRSG TELFA 3X8 NADH (GAUZE/BANDAGES/DRESSINGS) ×3 IMPLANT
GAUZE 4X4 16PLY ~~LOC~~+RFID DBL (SPONGE) ×4 IMPLANT
GLOVE SURG LTX SZ6.5 (GLOVE) ×4 IMPLANT
GLOVE SURG UNDER POLY LF SZ6.5 (GLOVE) ×4 IMPLANT
GLOVE SURG UNDER POLY LF SZ7 (GLOVE) ×8 IMPLANT
GOWN STRL REUS W/TWL LRG LVL3 (GOWN DISPOSABLE) ×8 IMPLANT
KIT PROCEDURE FLUENT (KITS) ×4 IMPLANT
KIT TURNOVER CYSTO (KITS) ×4 IMPLANT
PACK VAGINAL MINOR WOMEN LF (CUSTOM PROCEDURE TRAY) ×4 IMPLANT
PAD DRESSING TELFA 3X8 NADH (GAUZE/BANDAGES/DRESSINGS) IMPLANT
PAD OB MATERNITY 4.3X12.25 (PERSONAL CARE ITEMS) ×4 IMPLANT
SEAL ROD LENS SCOPE MYOSURE (ABLATOR) ×4 IMPLANT
TOWEL OR 17X26 10 PK STRL BLUE (TOWEL DISPOSABLE) ×4 IMPLANT

## 2021-07-05 NOTE — Discharge Instructions (Signed)
DISCHARGE INSTRUCTIONS: D&C / D&E The following instructions have been prepared to help you care for yourself upon your return home.   Personal hygiene:  Use sanitary pads for vaginal drainage, not tampons.  Shower the day after your procedure.  NO tub baths, pools or Jacuzzis for 2-3 weeks.  Wipe front to back after using the bathroom.  Activity and limitations:  Do NOT drive or operate any equipment for 24 hours. The effects of anesthesia are still present and drowsiness may result.  Do NOT rest in bed all day.  Walking is encouraged.  Walk up and down stairs slowly.  You may resume your normal activity in one to two days or as indicated by your physician.  Sexual activity: NO intercourse for at least 2 weeks after the procedure, or as indicated by your physician.  Diet: Eat a light meal as desired this evening. You may resume your usual diet tomorrow.  Return to work: You may resume your work activities in one to two days or as indicated by your doctor.  What to expect after your surgery: Expect to have vaginal bleeding/discharge for 2-3 days and spotting for up to 10 days. It is not unusual to have soreness for up to 1-2 weeks. You may have a slight burning sensation when you urinate for the first day. Mild cramps may continue for a couple of days. You may have a regular period in 2-6 weeks.  Call your doctor for any of the following:  Excessive vaginal bleeding, saturating and changing one pad every hour.  Inability to urinate 6 hours after discharge from hospital.  Pain not relieved by pain medication.  Fever of 100.4 F or greater.  Unusual vaginal discharge or odor.   Post Anesthesia Home Care Instructions  Activity: Get plenty of rest for the remainder of the day. A responsible individual must stay with you for 24 hours following the procedure.  For the next 24 hours, DO NOT: -Drive a car -Advertising copywriter -Drink alcoholic beverages -Take any medication unless  instructed by your physician -Make any legal decisions or sign important papers.  Meals: Start with liquid foods such as gelatin or soup. Progress to regular foods as tolerated. Avoid greasy, spicy, heavy foods. If nausea and/or vomiting occur, drink only clear liquids until the nausea and/or vomiting subsides. Call your physician if vomiting continues.  Special Instructions/Symptoms: Your throat may feel dry or sore from the anesthesia or the breathing tube placed in your throat during surgery. If this causes discomfort, gargle with warm salt water. The discomfort should disappear within 24 hours.  No acetaminophen/Tylenol until after 12:15 pm today if needed.

## 2021-07-05 NOTE — H&P (Signed)
Bailey Baker is an 30 y.o. female presenting for scheduled surgery. No current VB, denies abdominopelvic pain. PCP had placed Nexplanon in October after our preop visit but significant irr VB led to removal within the month. 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    Past Medical History:  Diagnosis Date   Asthma     Past Surgical History:  Procedure Laterality Date   NO PAST SURGERIES      Family History  Problem Relation Age of Onset   Hypertension Mother    Other Neg Hx     Social History:  reports that she quit smoking about 4 years ago. Her smoking use included cigarettes. She has never used smokeless tobacco. She reports that she does not currently use alcohol. She reports that she does not use drugs.  Allergies:  Allergies  Allergen Reactions   Milk-Related Compounds Swelling and Rash    Medications Prior to Admission  Medication Sig Dispense Refill Last Dose   acetaminophen (TYLENOL) 500 MG tablet Take 500 mg by mouth every 6 (six) hours as needed.   Past Week   fluticasone (FLOVENT HFA) 220 MCG/ACT inhaler Inhale 1 puff into the lungs at bedtime.    Past Month   ibuprofen (ADVIL) 600 MG tablet Take 1 tablet (600 mg total) by mouth every 6 (six) hours as needed for cramping. 30 tablet 1 More than a month   omeprazole (PRILOSEC) 20 MG capsule Take 1 capsule (20 mg total) by mouth daily. 30 capsule 0    Prenatal Vit-Fe Sulfate-FA-DHA  (PRENATAL VITAMIN/MIN +DHA) 27-0.8-200 MG CAPS Take 1 capsule by mouth daily.       Review of Systems  Constitutional:  Negative for chills and fever.  Respiratory:  Negative for shortness of breath.   Cardiovascular:  Negative for chest pain, palpitations and leg swelling.  Gastrointestinal:  Negative for abdominal pain, nausea and vomiting.  Neurological:  Negative for dizziness, weakness and headaches.  Psychiatric/Behavioral:  Negative for suicidal ideas.    Blood pressure (!) 116/91, pulse 89, temperature 98.7 F (37.1 C), temperature source Oral, resp. rate 20, height 5\' 2"  (1.575 m), weight 89.8 kg, last menstrual period 04/20/2021, SpO2 96 %, currently breastfeeding. Physical Exam Gen: NAD CV: CTAB, RRR Abd: BS x4, NTTP, soft GU deferred MSK: neg calf edema/Homan's BL Psych/neuro WNL  Results for orders placed or performed during the hospital encounter of 07/05/21 (from the past 24 hour(s))  Pregnancy, urine POC     Status: None   Collection Time: 07/05/21  5:47 AM  Result Value Ref Range   Preg Test, Ur NEGATIVE NEGATIVE    No results found.  Assessment/Plan: This is a 30yo G2P2002 found to have AUB after removal of levonorgestrel IUD in April of 2022. Preop imaging significant for thickened EMS with hypervascularity and Embx c/w endometrial polyp. Patient declined repeat hormonal therapy and opts for surgical mgmt in the form of hysteroscopy D&C with Myosure resection.  The patient was informed of the risks and benefits of a hysteroscopy with dilation and curettage. Risks included  but were not limited to bleeidng, infections, injury to the vulva, vagina or cerivx, or uterine perforation. If concern for latter, may proceed with diagnostic laparoscopy for evaluation of injury which may require surgical repair. Patient understands and is amenable.    Bailey Baker 07/05/2021, 7:25 AM

## 2021-07-05 NOTE — Op Note (Signed)
07/05/21   Surgeon: Ellison Hughs, MD Preoperative Diagnoses: AUB Postoperative Diagnoses: same as above   Procedures performed:  1) Hysteroscopy dilation and curettage 2) Myosure resection of endometrial pathology   IVF: 500cc LR EBL: <10cc UOP: 30cc clear yellow urine via red rubber  Fluid deficit: 315cc   Anesthesia: LMA  Findings: External genitalia WNL. Upon insertion of operative speculum, slight cervical stenosis consistent with prior LEEP. Upon insertion of Myosure scope, polypoid tissue projection at 12 o clock. Rest of endometrium appears WNL, BL ostia visualized both before and after resection.    Indications: Ms Mcclatchey is a 30yo [id number] with AUB. Office EMB shows endometrial polypoid issue while preoperative imaging via TVUS shows thickened EMS with hypervascularity. Pt desires surgical management of AUB. Consented for above procedures   RBA:  The patient was informed of the risks and benefits of a hysteroscopy with dilation and curettage. Risks included but were not limited to bleeidng, infections, injury to the vulva, vagina or cerivx, or uterine perforation. If concern for latter, may proceed with diagnostic laparoscopy for evaluation of injury which may require surgical repair. Patient understands and is amenable.      Operative details: Patient was taken to operating room where general anesthesia via LMA was established. Placed in dorsal lithotomy with appropriate padded points. No antibiotics given preop via ACOG recommendations. Time out was performed after vaginal prep was carried out with Betadine. Tenaculum then placed on posterior cervical lip. Pratt dilators used (19) to dilate internal os. Gentle traction used to dilate internal os. Upon insertion of Myosure hysteroscope, findings noted as above. Myosure resection carried out until visible pahtology was removed. Scope removed, D&C carried out in gentle fashion clockwise until gritty texture noted throughout. Scope  withdrawn, tenaculum removed. Bleeding controlled with pressure. All instruments removed from vagina.   Fluid deficit controlled throughout, final deficit as above  Patient tolerated procedure well. All counts correct at end of procedure

## 2021-07-05 NOTE — Anesthesia Postprocedure Evaluation (Signed)
Anesthesia Post Note  Patient: Bailey Baker  Procedure(s) Performed: DILATATION & CURETTAGE/HYSTEROSCOPY WITH MYOSURE     Patient location during evaluation: PACU Anesthesia Type: General Level of consciousness: awake and alert Pain management: pain level controlled Vital Signs Assessment: post-procedure vital signs reviewed and stable Respiratory status: spontaneous breathing, nonlabored ventilation and respiratory function stable Cardiovascular status: blood pressure returned to baseline and stable Postop Assessment: no apparent nausea or vomiting Anesthetic complications: no   No notable events documented.  Last Vitals:  Vitals:   07/05/21 0830 07/05/21 0909  BP: (!) 135/92 123/88  Pulse: 97 92  Resp: 20 14  Temp: 36.6 C 36.4 C  SpO2: 99% 100%    Last Pain:  Vitals:   07/05/21 0830  TempSrc:   PainSc: 8                  Bailey Baker,W. EDMOND

## 2021-07-05 NOTE — Anesthesia Procedure Notes (Signed)
Procedure Name: LMA Insertion Date/Time: 07/05/2021 7:32 AM Performed by: Bishop Limbo, CRNA Pre-anesthesia Checklist: Patient identified, Emergency Drugs available, Suction available and Patient being monitored Patient Re-evaluated:Patient Re-evaluated prior to induction Oxygen Delivery Method: Circle System Utilized Preoxygenation: Pre-oxygenation with 100% oxygen Induction Type: IV induction Ventilation: Mask ventilation without difficulty LMA: LMA inserted LMA Size: 4.0 Number of attempts: 1 Airway Equipment and Method: Bite block Placement Confirmation: positive ETCO2 Tube secured with: Tape Dental Injury: Teeth and Oropharynx as per pre-operative assessment

## 2021-07-05 NOTE — Transfer of Care (Signed)
Immediate Anesthesia Transfer of Care Note  Patient: Bailey Baker  Procedure(s) Performed: DILATATION & CURETTAGE/HYSTEROSCOPY WITH MYOSURE  Patient Location: PACU  Anesthesia Type:General  Level of Consciousness: awake, alert , oriented and patient cooperative  Airway & Oxygen Therapy: Patient Spontanous Breathing  Post-op Assessment: Pt stable  Post vital signs: Reviewed and stable  Last Vitals:  Vitals Value Taken Time  BP 123/94 07/05/21 0806  Temp 36.6 C 07/05/21 0806  Pulse 105 07/05/21 0809  Resp 22 07/05/21 0809  SpO2 98 % 07/05/21 0809  Vitals shown include unvalidated device data.  Last Pain:  Vitals:   07/05/21 0609  TempSrc: Oral  PainSc: 5       Patients Stated Pain Goal: 4 (07/05/21 6160)  Complications: No notable events documented.

## 2021-07-08 ENCOUNTER — Encounter (HOSPITAL_BASED_OUTPATIENT_CLINIC_OR_DEPARTMENT_OTHER): Payer: Self-pay | Admitting: Obstetrics and Gynecology

## 2021-07-08 LAB — SURGICAL PATHOLOGY

## 2021-07-21 DIAGNOSIS — L0390 Cellulitis, unspecified: Secondary | ICD-10-CM

## 2021-07-21 HISTORY — DX: Cellulitis, unspecified: L03.90

## 2022-06-24 ENCOUNTER — Encounter (HOSPITAL_BASED_OUTPATIENT_CLINIC_OR_DEPARTMENT_OTHER): Payer: Self-pay | Admitting: Obstetrics and Gynecology

## 2022-06-24 NOTE — Progress Notes (Addendum)
Spoke w/ via phone for pre-op interview--- [employer]---- UPT per anesthesia, surgeon orders pending.              Lab results------ COVID test -----patient states asymptomatic no test needed Arrive at -------0845 NPO after MN NO Solid Food.  Clear liquids from MN until---0745 Med rec completed Medications to take morning of surgery -----Doxycycline Diabetic medication ----- Patient instructed no nail polish to be worn day of surgery Patient instructed to bring photo id and insurance card day of surgery Patient aware to have Driver (ride ) / caregiver  Fiance [employer]ja  for 24 hours after surgery  Patient Special Instructions ----- Pre-Op special Istructions ----- Patient verbalized understanding of instructions that were given at this phone interview. Patient denies shortness of breath, chest pain, fever, cough at this phone interview.

## 2022-06-26 ENCOUNTER — Encounter (HOSPITAL_BASED_OUTPATIENT_CLINIC_OR_DEPARTMENT_OTHER): Payer: Self-pay | Admitting: Obstetrics and Gynecology

## 2022-06-26 ENCOUNTER — Encounter (HOSPITAL_BASED_OUTPATIENT_CLINIC_OR_DEPARTMENT_OTHER)
Admission: RE | Disposition: A | Discharge status: Home / Self Care | Payer: Self-pay | Source: Home / Self Care | Attending: Obstetrics and Gynecology

## 2022-06-26 ENCOUNTER — Ambulatory Visit (HOSPITAL_BASED_OUTPATIENT_CLINIC_OR_DEPARTMENT_OTHER): Payer: Medicaid Other | Admitting: Anesthesiology

## 2022-06-26 ENCOUNTER — Other Ambulatory Visit: Payer: Self-pay

## 2022-06-26 ENCOUNTER — Ambulatory Visit (HOSPITAL_BASED_OUTPATIENT_CLINIC_OR_DEPARTMENT_OTHER)
Admission: RE | Admit: 2022-06-26 | Discharge: 2022-06-26 | Disposition: A | Discharge status: Home / Self Care | Payer: Medicaid Other | Attending: Obstetrics and Gynecology | Admitting: Obstetrics and Gynecology

## 2022-06-26 DIAGNOSIS — Z87891 Personal history of nicotine dependence: Secondary | ICD-10-CM | POA: Diagnosis not present

## 2022-06-26 DIAGNOSIS — Z6838 Body mass index (BMI) 38.0-38.9, adult: Secondary | ICD-10-CM | POA: Insufficient documentation

## 2022-06-26 DIAGNOSIS — Z86001 Personal history of in-situ neoplasm of cervix uteri: Secondary | ICD-10-CM | POA: Diagnosis not present

## 2022-06-26 DIAGNOSIS — Z01818 Encounter for other preprocedural examination: Secondary | ICD-10-CM

## 2022-06-26 DIAGNOSIS — E669 Obesity, unspecified: Secondary | ICD-10-CM | POA: Insufficient documentation

## 2022-06-26 DIAGNOSIS — N838 Other noninflammatory disorders of ovary, fallopian tube and broad ligament: Secondary | ICD-10-CM | POA: Diagnosis not present

## 2022-06-26 DIAGNOSIS — N939 Abnormal uterine and vaginal bleeding, unspecified: Secondary | ICD-10-CM | POA: Diagnosis present

## 2022-06-26 DIAGNOSIS — Z30430 Encounter for insertion of intrauterine contraceptive device: Secondary | ICD-10-CM | POA: Diagnosis not present

## 2022-06-26 DIAGNOSIS — K219 Gastro-esophageal reflux disease without esophagitis: Secondary | ICD-10-CM | POA: Diagnosis not present

## 2022-06-26 HISTORY — PX: HYSTEROSCOPY WITH D & C: SHX1775

## 2022-06-26 HISTORY — PX: INTRAUTERINE DEVICE (IUD) INSERTION: SHX5877

## 2022-06-26 HISTORY — DX: Cellulitis, unspecified: L03.90

## 2022-06-26 LAB — CBC
HCT: 37.4 % (ref 36.0–46.0)
Hemoglobin: 12.4 g/dL (ref 12.0–15.0)
MCH: 28.8 pg (ref 26.0–34.0)
MCHC: 33.2 g/dL (ref 30.0–36.0)
MCV: 86.8 fL (ref 80.0–100.0)
Platelets: 297 10*3/uL (ref 150–400)
RBC: 4.31 MIL/uL (ref 3.87–5.11)
RDW: 12.5 % (ref 11.5–15.5)
WBC: 5.1 10*3/uL (ref 4.0–10.5)
nRBC: 0 % (ref 0.0–0.2)

## 2022-06-26 LAB — POCT PREGNANCY, URINE: Preg Test, Ur: NEGATIVE

## 2022-06-26 SURGERY — DILATATION AND CURETTAGE /HYSTEROSCOPY
Anesthesia: General | Site: Vagina

## 2022-06-26 MED ORDER — PROPOFOL 10 MG/ML IV BOLUS
INTRAVENOUS | Status: DC | PRN
  Administered 2022-06-26: 100 mg via INTRAVENOUS
  Administered 2022-06-26: 200 mg via INTRAVENOUS

## 2022-06-26 MED ORDER — SODIUM CHLORIDE 0.9 % IR SOLN
Status: DC | PRN
  Administered 2022-06-26: 3000 mL

## 2022-06-26 MED ORDER — LACTATED RINGERS IV SOLN: INTRAVENOUS | Status: DC

## 2022-06-26 MED ORDER — LIDOCAINE HCL 1 % IJ SOLN
INTRAMUSCULAR | Status: DC | PRN
  Administered 2022-06-26: 8 mL

## 2022-06-26 MED ORDER — ARTIFICIAL TEARS OPHTHALMIC OINT
TOPICAL_OINTMENT | OPHTHALMIC | Status: AC
  Filled 2022-06-26: qty 3.5

## 2022-06-26 MED ORDER — ONDANSETRON HCL 4 MG/2ML IJ SOLN: 4.0000 mg | Freq: Once | INTRAMUSCULAR | Status: DC | PRN

## 2022-06-26 MED ORDER — FENTANYL CITRATE (PF) 250 MCG/5ML IJ SOLN
INTRAMUSCULAR | Status: DC | PRN
  Administered 2022-06-26: 50 ug via INTRAVENOUS

## 2022-06-26 MED ORDER — POVIDONE-IODINE 10 % EX SWAB: 2.0000 | Freq: Once | CUTANEOUS | Status: DC

## 2022-06-26 MED ORDER — OXYCODONE HCL 5 MG PO TABS: 5.0000 mg | ORAL_TABLET | Freq: Once | ORAL | Status: DC | PRN

## 2022-06-26 MED ORDER — HYDROMORPHONE HCL 1 MG/ML IJ SOLN: 0.2500 mg | INTRAMUSCULAR | Status: DC | PRN

## 2022-06-26 MED ORDER — PROPOFOL 10 MG/ML IV BOLUS
INTRAVENOUS | Status: AC
  Filled 2022-06-26: qty 20

## 2022-06-26 MED ORDER — LIDOCAINE 2% (20 MG/ML) 5 ML SYRINGE
INTRAMUSCULAR | Status: DC | PRN
  Administered 2022-06-26: 60 mg via INTRAVENOUS

## 2022-06-26 MED ORDER — IBUPROFEN 800 MG PO TABS: 800.0000 mg | ORAL_TABLET | Freq: Three times a day (TID) | ORAL | 0 refills | Status: DC | PRN

## 2022-06-26 MED ORDER — FENTANYL CITRATE (PF) 100 MCG/2ML IJ SOLN
INTRAMUSCULAR | Status: AC
  Filled 2022-06-26: qty 2

## 2022-06-26 MED ORDER — KETOROLAC TROMETHAMINE 30 MG/ML IJ SOLN: 30.0000 mg | Freq: Once | INTRAMUSCULAR | Status: DC | PRN

## 2022-06-26 MED ORDER — DEXMEDETOMIDINE HCL IN NACL 80 MCG/20ML IV SOLN
INTRAVENOUS | Status: AC
  Filled 2022-06-26: qty 20

## 2022-06-26 MED ORDER — DEXMEDETOMIDINE HCL IN NACL 80 MCG/20ML IV SOLN
INTRAVENOUS | Status: DC | PRN
  Administered 2022-06-26: 12 ug via BUCCAL

## 2022-06-26 MED ORDER — OXYCODONE HCL 5 MG/5ML PO SOLN: 5.0000 mg | Freq: Once | ORAL | Status: DC | PRN

## 2022-06-26 MED ORDER — ONDANSETRON HCL 4 MG/2ML IJ SOLN
INTRAMUSCULAR | Status: DC | PRN
  Administered 2022-06-26: 4 mg via INTRAVENOUS

## 2022-06-26 MED ORDER — SCOPOLAMINE 1 MG/3DAYS TD PT72
1.0000 | MEDICATED_PATCH | TRANSDERMAL | Status: DC
  Administered 2022-06-26: 1.5 mg via TRANSDERMAL

## 2022-06-26 MED ORDER — DEXAMETHASONE SODIUM PHOSPHATE 10 MG/ML IJ SOLN
INTRAMUSCULAR | Status: DC | PRN
  Administered 2022-06-26: 10 mg via INTRAVENOUS

## 2022-06-26 MED ORDER — MIDAZOLAM HCL 2 MG/2ML IJ SOLN
INTRAMUSCULAR | Status: DC | PRN
  Administered 2022-06-26: 2 mg via INTRAVENOUS

## 2022-06-26 MED ORDER — SCOPOLAMINE 1 MG/3DAYS TD PT72
MEDICATED_PATCH | TRANSDERMAL | Status: AC
  Filled 2022-06-26: qty 1

## 2022-06-26 MED ORDER — ACETAMINOPHEN 500 MG PO TABS
ORAL_TABLET | ORAL | Status: AC
  Filled 2022-06-26: qty 2

## 2022-06-26 MED ORDER — MEPERIDINE HCL 25 MG/ML IJ SOLN: 6.2500 mg | INTRAMUSCULAR | Status: DC | PRN

## 2022-06-26 MED ORDER — ACETAMINOPHEN 500 MG PO TABS
1000.0000 mg | ORAL_TABLET | ORAL | Status: AC
  Administered 2022-06-26: 1000 mg via ORAL

## 2022-06-26 MED ORDER — MIDAZOLAM HCL 2 MG/2ML IJ SOLN
INTRAMUSCULAR | Status: AC
  Filled 2022-06-26: qty 2

## 2022-06-26 MED ORDER — LEVONORGESTREL 20 MCG/DAY IU IUD
INTRAUTERINE_SYSTEM | INTRAUTERINE | Status: AC
  Filled 2022-06-26: qty 1

## 2022-06-26 MED ORDER — LEVONORGESTREL 20 MCG/DAY IU IUD
1.0000 | INTRAUTERINE_SYSTEM | INTRAUTERINE | Status: AC
  Administered 2022-06-26: 1 via INTRAUTERINE

## 2022-06-26 MED ORDER — AMISULPRIDE (ANTIEMETIC) 5 MG/2ML IV SOLN: 10.0000 mg | Freq: Once | INTRAVENOUS | Status: DC | PRN

## 2022-06-26 SURGICAL SUPPLY — 13 items
GLOVE BIOGEL PI IND STRL 6.5 (GLOVE) ×3 IMPLANT
GLOVE BIOGEL PI IND STRL 7.0 (GLOVE) ×6 IMPLANT
GLOVE ECLIPSE 6.5 STRL STRAW (GLOVE) ×3 IMPLANT
GOWN STRL REUS W/TWL LRG LVL3 (GOWN DISPOSABLE) ×6 IMPLANT
GOWN STRL REUS W/TWL XL LVL3 (GOWN DISPOSABLE) IMPLANT
KIT PROCEDURE FLUENT (KITS) ×3 IMPLANT
KIT TURNOVER CYSTO (KITS) ×3 IMPLANT
Mirena IUD IMPLANT
PACK VAGINAL MINOR WOMEN LF (CUSTOM PROCEDURE TRAY) ×3 IMPLANT
PAD OB MATERNITY 4.3X12.25 (PERSONAL CARE ITEMS) ×3 IMPLANT
SEAL ROD LENS SCOPE MYOSURE (ABLATOR) ×3 IMPLANT
SPIKE FLUID TRANSFER (MISCELLANEOUS) IMPLANT
TOWEL OR 17X26 10 PK STRL BLUE (TOWEL DISPOSABLE) ×3 IMPLANT

## 2022-06-26 NOTE — Anesthesia Preprocedure Evaluation (Addendum)
Anesthesia Evaluation  Patient identified by MRN, date of birth, ID band Patient awake    Reviewed: Allergy & Precautions, NPO status , Patient's Chart, lab work & pertinent test results  Airway Mallampati: II  TM Distance: >3 FB Neck ROM: Full    Dental  (+) Teeth Intact, Dental Advisory Given   Pulmonary asthma , former smoker   Pulmonary exam normal breath sounds clear to auscultation       Cardiovascular negative cardio ROS Normal cardiovascular exam Rhythm:Regular Rate:Normal     Neuro/Psych negative neurological ROS  negative psych ROS   GI/Hepatic Neg liver ROS,GERD  Medicated and Controlled,,  Endo/Other  Obesity BMI 38  Renal/GU negative Renal ROS  negative genitourinary   Musculoskeletal negative musculoskeletal ROS (+)    Abdominal  (+) + obese  Peds  Hematology negative hematology ROS (+)   Anesthesia Other Findings   Reproductive/Obstetrics negative OB ROS                             Anesthesia Physical Anesthesia Plan  ASA: 2  Anesthesia Plan: General   Post-op Pain Management: Tylenol PO (pre-op)* and Toradol IV (intra-op)*   Induction: Intravenous  PONV Risk Score and Plan: 4 or greater and Ondansetron, Dexamethasone, Midazolam, Treatment may vary due to age or medical condition and Scopolamine patch - Pre-op  Airway Management Planned: LMA  Additional Equipment: None  Intra-op Plan:   Post-operative Plan: Extubation in OR  Informed Consent: I have reviewed the patients History and Physical, chart, labs and discussed the procedure including the risks, benefits and alternatives for the proposed anesthesia with the patient or authorized representative who has indicated his/her understanding and acceptance.     Dental advisory given  Plan Discussed with: CRNA  Anesthesia Plan Comments:        Anesthesia Quick Evaluation

## 2022-06-26 NOTE — Transfer of Care (Signed)
Immediate Anesthesia Transfer of Care Note  Patient: Texanna Hilburn  Procedure(s) Performed: DILATATION AND CURETTAGE /HYSTEROSCOPY (Vagina ) INTRAUTERINE DEVICE (IUD) INSERTION (Uterus)  Patient Location: PACU  Anesthesia Type:General  Level of Consciousness: drowsy and patient cooperative  Airway & Oxygen Therapy: Patient Spontanous Breathing  Post-op Assessment: Report given to RN and Post -op Vital signs reviewed and stable  Post vital signs: Reviewed and stable  Last Vitals:  Vitals Value Taken Time  BP 108/63 06/26/22 1127  Temp    Pulse 81 06/26/22 1129  Resp 0 06/26/22 1129  SpO2 98 % 06/26/22 1129  Vitals shown include unvalidated device data.  Last Pain:  Vitals:   06/26/22 0916  TempSrc: Oral  PainSc: 3       Patients Stated Pain Goal: 8 (06/26/22 0916)  Complications: No notable events documented.

## 2022-06-26 NOTE — Op Note (Signed)
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 Cervix appears WNL, mild stenosis c/w prior LEEP however sounded and dilated without issue. Uterus sounds to 8cm. Insertion of scope shows normal endometrium with some shagginess posterior 4-8o clock however no apparent polyp no fibroid burden, tehrefore formal myosure sampling not done   Indications: Bailey Baker is a 31yo [phone] with AUB. Office EMB shows Tubal hyperplasia with prolif endo, neg hyperplasia/atyipia  while preoperative imaging via TVUS shows 8.6x5.2x4.4cm, EMS 65mm heterogenous, cervix with prominant blood vessel at external os . Pt desires surgical management of AUB. Consented for above procedures   RBA:  The patient was informed of the risks and benefits of a hysteroscopy with dilation and curettage. Risks included but were not limited to bleeidng, infections, injury to the vulva, vagina or cerivx, or uterine perforation. If concern for latter, may proceed with diagnostic laparoscopy for evaluation of injury which may require surgical repair. Patient understands and is amenable.      Operative details: Patient was taken to operating room where general anesthesia via LMA was established. Placed in dorsal lithotomy with appropriate padded points. No antibiotics given preop via ACOG recommendations. Time out was performed after vaginal prep was carried out with Betadine. Tenaculum then placed on anterior cervical lip. Pratt dilators used (17) to dilate internal os. Gentle traction used to dilate internal os. Upon insertion of Myosure hysteroscope, findings noted as above. Scope removed, D&C  carried out in gentle fashion clockwise until gritty texture noted throughout. Endocervical curettage performed after endometrial curetting passed off field. Bleeding controlled with pressure.   Mirena IUD then placed in standard fashion. All instruments removed from vagina.   Fluid deficit controlled throughout, final deficit as above  Patient tolerated procedure well. All counts correct at end of procedure

## 2022-06-26 NOTE — H&P (Signed)
Bailey Baker is an 31 y.o. 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    Past Medical History:  Diagnosis Date   Asthma    Cellulitis    In scalp    Past Surgical History:  Procedure Laterality Date   DILATATION & CURETTAGE/HYSTEROSCOPY WITH MYOSURE N/A 07/05/2021   Procedure: DILATATION & CURETTAGE/HYSTEROSCOPY WITH MYOSURE;  Surgeon: Carlisle Cater, MD;  Location: [facility];  Service: Gynecology;  Laterality: N/A;   NO PAST SURGERIES      Family History  Problem Relation Age of Onset   Hypertension Mother    Other Neg Hx     Social History:  reports that she quit smoking about 5 years ago. Her smoking use included cigarettes. She has never used smokeless tobacco. She reports that she does not currently use alcohol. She reports that she does not use drugs.  Allergies:  Allergies  Allergen Reactions   Milk-Related Compounds Swelling and Rash    Medications Prior to Admission  Medication Sig Dispense Refill Last Dose   acetaminophen (TYLENOL) 500 MG tablet Take 500 mg by mouth every 6 (six) hours as needed.   Past Week   doxycycline (ADOXA) 100 MG tablet Take 100 mg by mouth 2 (two) times daily.   06/25/2022   fluticasone (FLOVENT HFA) 220 MCG/ACT inhaler Inhale 1 puff into the lungs at bedtime.    06/24/2022   ibuprofen (ADVIL) 800 MG tablet Take 1 tablet (800 mg total) by mouth every 8 (eight) hours as needed. 30 tablet 0 Past Month   omeprazole (PRILOSEC) 20  MG capsule Take 1 capsule (20 mg total) by mouth daily. 30 capsule 0     Review of Systems  Constitutional:  Negative for chills and fever.  Respiratory:  Negative for shortness of breath.   Cardiovascular:  Negative for chest pain, palpitations and leg swelling.  Gastrointestinal:  Negative for abdominal pain, nausea and vomiting.  Genitourinary:  Positive for vaginal bleeding.  Neurological:  Negative for dizziness, weakness and headaches.  Psychiatric/Behavioral:  Negative for suicidal ideas.     Blood pressure (!) 145/93, pulse 80, temperature 98.2 F (36.8 C), temperature source Oral, resp. rate 17, height 5\' 2"  (1.575 m), weight 93.7 kg, last menstrual period 06/18/2022, SpO2 98 %, currently breastfeeding. Physical Exam Gen: AAF in NAD CV: CTAB, RRR Abd: soft, BSx4, overweight GU  (from 9/19): cervix WNL c/w prior LEEP. Dark blood in fornices cleared with scopette x2. Evidence of dark bleeding from external os, no visible polypoid structures protruding through os. Anteverted uterus, mobile, NTTP.  MSK: No calf TPT BL, neg Homans Psych: Mood/affect WNL Results for orders placed or performed during the hospital encounter of 06/26/22 (from the past 24 hour(s))  Pregnancy, urine POC     Status: None   Collection Time: 06/26/22  9:06 AM  Result Value Ref Range   Preg Test, Ur NEGATIVE NEGATIVE    No results found. TVUS and EMBx 9/19: 8.6x5.2x4.4cm, EMS 42mm heterogenous, cervix with prominant blood vessel at external os Tubal hyperplasia with prolif endo, neg hyperplasia/atyipia  Assessment/Plan: This is a  31yo [id number] with AUB since August with h/o D&C for AUB-P previously. Repeat imaging and EMBx as above, patient elects for surgical management and IUD placement for contraception The patient was informed of the risks and benefits of a hysteroscopy with dilation and curettage. Risks included but were not limited to bleeidng, infections, injury to the vulva, vagina or cerivx, or  uterine perforation. If concern for latter, may proceed with diagnostic laparoscopy for evaluation of injury which may require surgical repair. Patient understands and is amenable.  Patient understands that IUD may cause irregular vaginal bleeding for 2-65mths, should use back up contraception in form of condoms for 2wks. Vaginal odor may occur 2/2 presence of strings.   Valerie Roys Jermani Pund 06/26/2022, 9:20 AM

## 2022-06-26 NOTE — Anesthesia Postprocedure Evaluation (Signed)
Anesthesia Post Note  Patient: Bailey Baker  Procedure(s) Performed: DILATATION AND CURETTAGE /HYSTEROSCOPY (Vagina ) INTRAUTERINE DEVICE (IUD) INSERTION (Uterus)     Patient location during evaluation: PACU Anesthesia Type: General Level of consciousness: awake and alert, oriented and patient cooperative Pain management: pain level controlled Vital Signs Assessment: post-procedure vital signs reviewed and stable Respiratory status: spontaneous breathing, nonlabored ventilation and respiratory function stable Cardiovascular status: blood pressure returned to baseline and stable Postop Assessment: no apparent nausea or vomiting Anesthetic complications: no   No notable events documented.  Last Vitals:  Vitals:   06/26/22 1200 06/26/22 1230  BP: 120/76 122/86  Pulse: 74 87  Resp: 12 14  Temp: (!) 36.2 C 36.6 C  SpO2: 99% 100%    Last Pain:  Vitals:   06/26/22 1230  TempSrc:   PainSc: 0-No pain                 Lannie Fields

## 2022-06-26 NOTE — Interval H&P Note (Signed)
History and Physical Interval Note:  06/26/2022 10:16 AM  [name]  has presented today for surgery, with the diagnosis of abnormal uterine bleeding.  The various methods of treatment have been discussed with the patient and family. After consideration of risks, benefits and other options for treatment, the patient has consented to  Procedure(s) with comments: DILATATION AND CURETTAGE /HYSTEROSCOPY (N/A) INTRAUTERINE DEVICE (IUD) INSERTION (N/A) - Mirena as a surgical intervention.  The patient's history has been reviewed, patient examined, no change in status, stable for surgery.  I have reviewed the patient's chart and labs.  Questions were answered to the patient's satisfaction.     Bailey Baker

## 2022-06-26 NOTE — Discharge Instructions (Signed)
   NO TYLENOL/ ACETAMINOPHEN PRODUCTS UNTIL AFTER 3:20PM TODAY          Post Anesthesia Home Care Instructions  Activity: Get plenty of rest for the remainder of the day. A responsible individual must stay with you for 24 hours following the procedure.  For the next 24 hours, DO NOT: -Drive a car -Advertising copywriter -Drink alcoholic beverages -Take any medication unless instructed by your physician -Make any legal decisions or sign important papers.  Meals: Start with liquid foods such as gelatin or soup. Progress to regular foods as tolerated. Avoid greasy, spicy, heavy foods. If nausea and/or vomiting occur, drink only clear liquids until the nausea and/or vomiting subsides. Call your physician if vomiting continues.  Special Instructions/Symptoms: Your throat may feel dry or sore from the anesthesia or the breathing tube placed in your throat during surgery. If this causes discomfort, gargle with warm salt water. The discomfort should disappear within 24 hours.  If you had a scopolamine patch placed behind your ear for the management of post- operative nausea and/or vomiting:  1. The medication in the patch is effective for 72 hours, after which it should be removed.  Wrap patch in a tissue and discard in the trash. Wash hands thoroughly with soap and water. 2. You may remove the patch earlier than 72 hours if you experience unpleasant side effects which may include dry mouth, dizziness or visual disturbances. 3. Avoid touching the patch. Wash your hands with soap and water after contact with the patch.

## 2022-06-26 NOTE — Anesthesia Procedure Notes (Signed)
Procedure Name: LMA Insertion Date/Time: 06/26/2022 10:50 AM  Performed by: Dairl Ponder, CRNAPre-anesthesia Checklist: Patient identified, Emergency Drugs available, Suction available and Patient being monitored Patient Re-evaluated:Patient Re-evaluated prior to induction Oxygen Delivery Method: Circle System Utilized Preoxygenation: Pre-oxygenation with 100% oxygen Induction Type: IV induction Ventilation: Mask ventilation without difficulty LMA: LMA inserted LMA Size: 4.0 Number of attempts: 1 Airway Equipment and Method: Bite block Placement Confirmation: positive ETCO2 Tube secured with: Tape Dental Injury: Teeth and Oropharynx as per pre-operative assessment

## 2022-06-27 ENCOUNTER — Encounter (HOSPITAL_BASED_OUTPATIENT_CLINIC_OR_DEPARTMENT_OTHER): Payer: Self-pay | Admitting: Obstetrics and Gynecology

## 2022-06-27 LAB — SURGICAL PATHOLOGY

## 2022-09-24 ENCOUNTER — Other Ambulatory Visit: Payer: Self-pay | Admitting: Obstetrics and Gynecology

## 2022-09-24 DIAGNOSIS — N6452 Nipple discharge: Secondary | ICD-10-CM

## 2022-10-18 ENCOUNTER — Other Ambulatory Visit: Payer: Medicaid Other

## 2022-10-20 ENCOUNTER — Other Ambulatory Visit: Payer: Medicaid Other

## 2022-10-29 ENCOUNTER — Other Ambulatory Visit: Payer: Medicaid Other

## 2022-10-29 ENCOUNTER — Ambulatory Visit: Payer: Medicaid Other

## 2022-10-29 ENCOUNTER — Ambulatory Visit
Admission: RE | Admit: 2022-10-29 | Discharge: 2022-10-29 | Disposition: A | Discharge status: Home / Self Care | Payer: Medicaid Other | Source: Ambulatory Visit | Attending: Obstetrics and Gynecology | Admitting: Obstetrics and Gynecology

## 2022-10-29 DIAGNOSIS — N6452 Nipple discharge: Secondary | ICD-10-CM

## 2023-01-16 ENCOUNTER — Encounter (HOSPITAL_BASED_OUTPATIENT_CLINIC_OR_DEPARTMENT_OTHER): Payer: Self-pay | Admitting: Obstetrics and Gynecology

## 2023-01-16 ENCOUNTER — Other Ambulatory Visit: Payer: Self-pay

## 2023-01-16 DIAGNOSIS — Z01812 Encounter for preprocedural laboratory examination: Secondary | ICD-10-CM | POA: Diagnosis not present

## 2023-01-16 NOTE — Progress Notes (Signed)
Spoke w/ via phone for pre-op interview---[facility] needs dos---urine pregnancy per anesthesia, surgeon orders pending               Lab results------02/09/23 lab appt for cbc, type & screen COVID test -----patient states asymptomatic no test needed Arrive at -------0530 on Thursday, 02/12/23 NPO after MN NO Solid Food.  Clear liquids from MN until---0430 Med rec completed Medications to take morning of surgery -----none Diabetic medication -----n/a Patient instructed no nail polish to be worn day of surgery Patient instructed to bring photo id and insurance card day of surgery Patient aware to have Driver (ride ) / caregiver    for 24 hours after surgery - fiance,Bailey Baker Patient Special Instructions -----Extended / overnight stay instructions given. Pre-Op special Instructions -----Requested orders from Dr. Reina Fuse via Epic IB on 01/16/23. Patient verbalized understanding of instructions that were given at this phone interview. Patient denies shortness of breath, chest pain, fever, cough at this phone interview.  Hx was updated by Kerry Kass, RN on 06/24/2022. Patient states no changes in medical history since 06/24/2022.

## 2023-01-16 NOTE — Progress Notes (Signed)
Your procedure is scheduled on Thursday, 02/12/2023.  Report to [facility] AT  5:30 AM.   Call this number if you have problems the morning of surgery  :[phone].   OUR ADDRESS IS [address].  WE ARE LOCATED IN [facility].  PLEASE BRING YOUR INSURANCE CARD AND PHOTO ID DAY OF SURGERY.  ONLY 2 PEOPLE ARE ALLOWED IN  WAITING  ROOM                                      REMEMBER:  DO NOT EAT FOOD, CANDY GUM OR MINTS  AFTER MIDNIGHT THE NIGHT BEFORE YOUR SURGERY . YOU MAY HAVE CLEAR LIQUIDS FROM MIDNIGHT THE NIGHT BEFORE YOUR SURGERY UNTIL  4:30 AM. NO CLEAR LIQUIDS AFTER   4:30 AM DAY OF SURGERY.  YOU MAY  BRUSH YOUR TEETH MORNING OF SURGERY AND RINSE YOUR MOUTH OUT, NO CHEWING GUM CANDY OR MINTS.     CLEAR LIQUID DIET    Allowed      Water                                                                   Coffee and tea, regular and decaf  (NO cream or milk products of any type, may sweeten)                         Carbonated beverages, regular and diet                                    Sports drinks like Gatorade _____________________________________________________________________     TAKE ONLY THESE MEDICATIONS MORNING OF SURGERY: NONE                                        DO NOT WEAR JEWERLY/  METAL/  PIERCINGS (INCLUDING NO PLASTIC PIERCINGS) DO NOT WEAR LOTIONS, POWDERS, PERFUMES OR NAIL POLISH ON YOUR FINGERNAILS. TOENAIL POLISH IS OK TO WEAR. DO NOT SHAVE FOR 48 HOURS PRIOR TO DAY OF SURGERY.  CONTACTS, GLASSES, OR DENTURES MAY NOT BE WORN TO SURGERY.  REMEMBER: NO SMOKING, VAPING ,  DRUGS OR ALCOHOL FOR 24 HOURS BEFORE YOUR SURGERY.                                    [facility] IS NOT RESPONSIBLE  FOR ANY BELONGINGS.                                                                    Marland Kitchen           [facility] - Preparing for Surgery Before surgery,  you can play an important role.  Because skin is not sterile,  your skin needs to be as free of germs as possible.  You can reduce the number of germs on your skin by washing with CHG (chlorahexidine gluconate) soap before surgery.  CHG is an antiseptic cleaner which kills germs and bonds with the skin to continue killing germs even after washing. Please DO NOT use if you have an allergy to CHG or antibacterial soaps.  If your skin becomes reddened/irritated stop using the CHG and inform your nurse when you arrive at Short Stay. Do not shave (including legs and underarms) for at least 48 hours prior to the first CHG shower.  You may shave your face/neck. Please follow these instructions carefully:  1.  Shower with CHG Soap the night before surgery and the  morning of Surgery.  2.  If you choose to wash your hair, wash your hair first as usual with your  normal  shampoo.  3.  After you shampoo, rinse your hair and body thoroughly to remove the  shampoo.                                        4.  Use CHG as you would any other liquid soap.  You can apply chg directly  to the skin and wash , chg soap provided, night before and morning of your surgery.  5.  Apply the CHG Soap to your body ONLY FROM THE NECK DOWN.   Do not use on face/ open                           Wound or open sores. Avoid contact with eyes, ears mouth and genitals (private parts).                       Wash face,  Genitals (private parts) with your normal soap.             6.  Wash thoroughly, paying special attention to the area where your surgery  will be performed.  7.  Thoroughly rinse your body with warm water from the neck down.  8.  DO NOT shower/wash with your normal soap after using and rinsing off  the CHG Soap.             9.  Pat yourself dry with a clean towel.            10.  Wear clean pajamas.            11.  Place clean sheets on your bed the night of your first shower and do not  sleep with pets. Day of Surgery : Do not apply any lotions/ powders the morning of surgery.   Please wear clean clothes to the hospital/surgery center.  IF YOU HAVE ANY SKIN IRRITATION OR PROBLEMS WITH THE SURGICAL SOAP, PLEASE GET A BAR OF GOLD DIAL SOAP AND SHOWER THE NIGHT BEFORE YOUR SURGERY AND THE MORNING OF YOUR SURGERY. PLEASE LET THE NURSE KNOW MORNING OF YOUR SURGERY IF YOU HAD ANY PROBLEMS WITH THE SURGICAL SOAP.   YOUR SURGEON MAY HAVE REQUESTED EXTENDED RECOVERY TIME AFTER YOUR SURGERY. IT COULD BE A  JUST A FEW HOURS  UP TO AN OVERNIGHT STAY.  YOUR SURGEON SHOULD HAVE DISCUSSED THIS WITH YOU PRIOR TO YOUR SURGERY.  IN THE EVENT YOU NEED TO STAY OVERNIGHT PLEASE REFER TO THE FOLLOWING GUIDELINES. YOU MAY HAVE UP TO 4 VISITORS  MAY VISIT IN THE EXTENDED RECOVERY ROOM UNTIL 800 PM ONLY.  ONE  VISITOR AGE 44 AND OVER MAY SPEND THE NIGHT AND MUST BE IN EXTENDED RECOVERY ROOM NO LATER THAN 800 PM . YOUR DISCHARGE TIME AFTER YOU SPEND THE NIGHT IS 900 AM THE MORNING AFTER YOUR SURGERY. YOU MAY PACK A SMALL OVERNIGHT BAG WITH TOILETRIES FOR YOUR OVERNIGHT STAY IF YOU WISH.  REGARDLESS OF IF YOU STAY OVER NIGHT OR ARE DISCHARGED THE SAME DAY YOU WILL BE REQUIRED TO HAVE A RESPONSIBLE ADULT (18 YRS OLD OR OLDER) STAY WITH YOU FOR AT LEAST THE FIRST 24 HOURS  YOUR PRESCRIPTION MEDICATIONS WILL BE PROVIDED DURING YOUR HOSPITAL STAY.  ________________________________________________________________________                                                        QUESTIONS Bailey Baker PRE OP NURSE PHONE [phone].

## 2023-01-19 ENCOUNTER — Ambulatory Visit (HOSPITAL_BASED_OUTPATIENT_CLINIC_OR_DEPARTMENT_OTHER): Admit: 2023-01-19 | Payer: Medicaid Other | Admitting: Obstetrics and Gynecology

## 2023-01-19 ENCOUNTER — Encounter (HOSPITAL_BASED_OUTPATIENT_CLINIC_OR_DEPARTMENT_OTHER): Payer: Self-pay

## 2023-01-19 SURGERY — HYSTERECTOMY, VAGINAL
Anesthesia: General

## 2023-02-09 ENCOUNTER — Encounter (HOSPITAL_COMMUNITY)
Admission: RE | Admit: 2023-02-09 | Discharge: 2023-02-09 | Disposition: A | Discharge status: Home / Self Care | Payer: Medicaid Other | Source: Ambulatory Visit | Attending: Obstetrics and Gynecology | Admitting: Obstetrics and Gynecology

## 2023-02-09 DIAGNOSIS — Z01818 Encounter for other preprocedural examination: Secondary | ICD-10-CM

## 2023-02-09 DIAGNOSIS — Z01812 Encounter for preprocedural laboratory examination: Secondary | ICD-10-CM | POA: Insufficient documentation

## 2023-02-09 LAB — CBC
HCT: 39.6 % (ref 36.0–46.0)
Hemoglobin: 12.8 g/dL (ref 12.0–15.0)
MCH: 29.1 pg (ref 26.0–34.0)
MCHC: 32.3 g/dL (ref 30.0–36.0)
MCV: 90 fL (ref 80.0–100.0)
Platelets: 268 10*3/uL (ref 150–400)
RBC: 4.4 MIL/uL (ref 3.87–5.11)
RDW: 12.6 % (ref 11.5–15.5)
WBC: 5.7 10*3/uL (ref 4.0–10.5)
nRBC: 0 % (ref 0.0–0.2)

## 2023-02-09 LAB — TYPE AND SCREEN: ABO/RH(D): A POS

## 2023-02-10 NOTE — H&P (Signed)
Bailey Baker is an 32 y.o. 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 (Menstrual status: IUD).    Past Medical History:  Diagnosis Date   Abnormal uterine bleeding (AUB)    Asthma    mild   Cellulitis 2023   cellutlitis on scalp   Hx of essential hypertension    Patient stopped taking Labetalol in 2022. Blood pressure has been doing well since 2022, per patient.   Wears contact lenses    Wears glasses     Past Surgical History:  Procedure Laterality Date   DILATATION & CURETTAGE/HYSTEROSCOPY WITH MYOSURE N/A 07/05/2021   Procedure: DILATATION & CURETTAGE/HYSTEROSCOPY WITH MYOSURE;  Surgeon: Carlisle Cater, MD;  Location: [facility];  Service: Gynecology;  Laterality: N/A;   HYSTEROSCOPY WITH D & C N/A 06/26/2022   Procedure: DILATATION AND CURETTAGE /HYSTEROSCOPY;  Surgeon: Carlisle Cater, MD;  Location: [facility];  Service: Gynecology;  Laterality: N/A;   INTRAUTERINE DEVICE (IUD) INSERTION N/A 06/26/2022   Procedure: INTRAUTERINE DEVICE (IUD) INSERTION;  Surgeon: Carlisle Cater, MD;  Location: [facility];  Service: Gynecology;  Laterality: N/A;  Mirena    Family History  Problem Relation Age of Onset   Hypertension Mother    Other Neg Hx     Social History:  reports that she quit smoking about 6 years ago. Her smoking use included cigarettes. She has never used  smokeless tobacco. She reports current alcohol use. She reports that she does not use drugs.  Allergies:  Allergies  Allergen Reactions   Milk-Related Compounds Swelling and Rash    No medications prior to admission.    Review of Systems  Constitutional:  Negative for chills and fever.  Respiratory:  Negative for shortness of breath.   Cardiovascular:  Negative for chest pain, palpitations and leg swelling.  Gastrointestinal:  Negative for abdominal pain, nausea and vomiting.  Genitourinary:  Positive for pelvic pain.  Neurological:  Negative for dizziness, weakness and headaches.  Psychiatric/Behavioral:  Negative for suicidal ideas.     Height 5' 2.5" (1.588 m), weight 95.3 kg. Physical Exam Gen: AAF in NAD CV: CTAB, RRR Abd: soft, BSx4, overweight GU: cervix WNL c/w prior LEEP. No visible polypoid structures protruding through os. Anteverted uterus, mobile, NTTP. Strings not viz, BSUS shows IUD at fundus, non-rotated MSK: No calf TTP BL, neg Homans Psych: Mood/affect WNL No results found for this or any previous visit (from the past 24 hour(s)).  No results found. TVUS: 09/09/22: 9.2x5.5x4.9cm, EMS 10.65mm, possible posterior polyp 0.9x1.4x0.6cm. BL ov WNL, striations c/w adeno EMBx 09/30/22: INACTIVE PATTERN ENDOMETRIUM CONSISTENT WITH EXOGENOUS PROGESTERONE EFFECT AND BREAKDOWN NO EVIDENCE OF UNOPPOSED ESTROGEN EFFECT (HYPERPLASIA) OR MALIGNANCY  Assessment/Plan: 48WG [id number] on Mirena IUD presenting for hysterectomy - AUB-HMB-A with worsening dysmenorrhea and c/f new polyp formation on TVUS despite Mirena. 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 H/o SVD x2,pelvis proven to 5lb12oz. BMI 38.1, no abdominal surgery history.   Risks of TVH/cysto with possible BS include infection of the uterus, pelvic organs,  or skin, inadvertent injury to internal organs, such as bowel or bladder. If there is major injury, extensive surgery may be required. If injury is minor, it may be  treated with relative ease. Discussed possibility of excessive blood loss and transfusion. Patient aware that no future fertility will remain after procedure. Patient accepts the possibility of blood transfusion, if necessary. Bowel and/or bladder injury may require prolonged inpatient stay and possible colostomy, Foley catheter, etc, as deemed fit by other surgeon. Discussed possibility of converting to LAVH vs TAH. Patient understands and agrees to move forward with surgery.  Valerie Roys Cheston Coury 02/10/2023, 12:39 PM

## 2023-02-12 ENCOUNTER — Encounter (HOSPITAL_BASED_OUTPATIENT_CLINIC_OR_DEPARTMENT_OTHER)
Admission: RE | Disposition: A | Discharge status: Home / Self Care | Payer: Self-pay | Source: Home / Self Care | Attending: Obstetrics and Gynecology

## 2023-02-12 ENCOUNTER — Ambulatory Visit (HOSPITAL_BASED_OUTPATIENT_CLINIC_OR_DEPARTMENT_OTHER): Payer: Medicaid Other | Admitting: Anesthesiology

## 2023-02-12 ENCOUNTER — Ambulatory Visit (HOSPITAL_BASED_OUTPATIENT_CLINIC_OR_DEPARTMENT_OTHER)
Admission: RE | Admit: 2023-02-12 | Discharge: 2023-02-13 | Disposition: A | Discharge status: Home / Self Care | Payer: Medicaid Other | Attending: Obstetrics and Gynecology | Admitting: Obstetrics and Gynecology

## 2023-02-12 ENCOUNTER — Encounter (HOSPITAL_BASED_OUTPATIENT_CLINIC_OR_DEPARTMENT_OTHER): Payer: Self-pay | Admitting: Obstetrics and Gynecology

## 2023-02-12 ENCOUNTER — Other Ambulatory Visit: Payer: Self-pay

## 2023-02-12 DIAGNOSIS — N888 Other specified noninflammatory disorders of cervix uteri: Secondary | ICD-10-CM | POA: Insufficient documentation

## 2023-02-12 DIAGNOSIS — N83201 Unspecified ovarian cyst, right side: Secondary | ICD-10-CM | POA: Insufficient documentation

## 2023-02-12 DIAGNOSIS — Z87891 Personal history of nicotine dependence: Secondary | ICD-10-CM | POA: Insufficient documentation

## 2023-02-12 DIAGNOSIS — Z87410 Personal history of cervical dysplasia: Secondary | ICD-10-CM | POA: Insufficient documentation

## 2023-02-12 DIAGNOSIS — E669 Obesity, unspecified: Secondary | ICD-10-CM | POA: Diagnosis not present

## 2023-02-12 DIAGNOSIS — N939 Abnormal uterine and vaginal bleeding, unspecified: Secondary | ICD-10-CM

## 2023-02-12 DIAGNOSIS — D259 Leiomyoma of uterus, unspecified: Secondary | ICD-10-CM | POA: Diagnosis not present

## 2023-02-12 DIAGNOSIS — N938 Other specified abnormal uterine and vaginal bleeding: Secondary | ICD-10-CM | POA: Insufficient documentation

## 2023-02-12 DIAGNOSIS — Z01818 Encounter for other preprocedural examination: Secondary | ICD-10-CM

## 2023-02-12 DIAGNOSIS — Z6837 Body mass index (BMI) 37.0-37.9, adult: Secondary | ICD-10-CM | POA: Insufficient documentation

## 2023-02-12 DIAGNOSIS — K219 Gastro-esophageal reflux disease without esophagitis: Secondary | ICD-10-CM | POA: Diagnosis not present

## 2023-02-12 DIAGNOSIS — N946 Dysmenorrhea, unspecified: Secondary | ICD-10-CM | POA: Diagnosis not present

## 2023-02-12 HISTORY — DX: Personal history of other diseases of the circulatory system: Z86.79

## 2023-02-12 HISTORY — PX: VAGINAL HYSTERECTOMY: SHX2639

## 2023-02-12 HISTORY — DX: Abnormal uterine and vaginal bleeding, unspecified: N93.9

## 2023-02-12 HISTORY — DX: Presence of spectacles and contact lenses: Z97.3

## 2023-02-12 HISTORY — PX: OVARIAN CYST REMOVAL: SHX89

## 2023-02-12 HISTORY — PX: CYSTOSCOPY: SHX5120

## 2023-02-12 LAB — TYPE AND SCREEN: Antibody Screen: NEGATIVE

## 2023-02-12 LAB — POCT PREGNANCY, URINE: Preg Test, Ur: NEGATIVE

## 2023-02-12 SURGERY — HYSTERECTOMY, VAGINAL
Anesthesia: General | Site: Vagina

## 2023-02-12 MED ORDER — AMISULPRIDE (ANTIEMETIC) 5 MG/2ML IV SOLN: 10.0000 mg | Freq: Once | INTRAVENOUS | Status: DC | PRN

## 2023-02-12 MED ORDER — LACTATED RINGERS IV SOLN: INTRAVENOUS | Status: DC

## 2023-02-12 MED ORDER — ROCURONIUM BROMIDE 10 MG/ML (PF) SYRINGE
PREFILLED_SYRINGE | INTRAVENOUS | Status: DC | PRN
  Administered 2023-02-12: 90 mg via INTRAVENOUS

## 2023-02-12 MED ORDER — LIDOCAINE 2% (20 MG/ML) 5 ML SYRINGE
INTRAMUSCULAR | Status: DC | PRN
  Administered 2023-02-12: 100 mg via INTRAVENOUS

## 2023-02-12 MED ORDER — KETOROLAC TROMETHAMINE 30 MG/ML IJ SOLN
INTRAMUSCULAR | Status: AC
  Filled 2023-02-12: qty 1

## 2023-02-12 MED ORDER — SIMETHICONE 80 MG PO CHEW
CHEWABLE_TABLET | ORAL | Status: AC
  Filled 2023-02-12: qty 1

## 2023-02-12 MED ORDER — OXYCODONE HCL 5 MG PO TABS
ORAL_TABLET | ORAL | Status: AC
  Filled 2023-02-12: qty 1

## 2023-02-12 MED ORDER — PROMETHAZINE HCL 25 MG/ML IJ SOLN: 6.2500 mg | INTRAMUSCULAR | Status: DC | PRN

## 2023-02-12 MED ORDER — KETOROLAC TROMETHAMINE 30 MG/ML IJ SOLN
INTRAMUSCULAR | Status: DC | PRN
  Administered 2023-02-12: 30 mg via INTRAVENOUS

## 2023-02-12 MED ORDER — DEXAMETHASONE SODIUM PHOSPHATE 10 MG/ML IJ SOLN
INTRAMUSCULAR | Status: AC
  Filled 2023-02-12: qty 1

## 2023-02-12 MED ORDER — PROPOFOL 500 MG/50ML IV EMUL
INTRAVENOUS | Status: AC
  Filled 2023-02-12: qty 50

## 2023-02-12 MED ORDER — ONDANSETRON HCL 4 MG/2ML IJ SOLN
INTRAMUSCULAR | Status: AC
  Filled 2023-02-12: qty 2

## 2023-02-12 MED ORDER — OXYCODONE HCL 5 MG PO TABS
ORAL_TABLET | ORAL | Status: AC
  Filled 2023-02-12: qty 2

## 2023-02-12 MED ORDER — POLYETHYLENE GLYCOL 3350 17 G PO PACK: 17.0000 g | PACK | Freq: Every day | ORAL | Status: DC | PRN

## 2023-02-12 MED ORDER — DEXMEDETOMIDINE HCL IN NACL 80 MCG/20ML IV SOLN
INTRAVENOUS | Status: AC
  Filled 2023-02-12: qty 20

## 2023-02-12 MED ORDER — ACETAMINOPHEN 500 MG PO TABS
ORAL_TABLET | ORAL | Status: AC
  Filled 2023-02-12: qty 2

## 2023-02-12 MED ORDER — CEFAZOLIN SODIUM-DEXTROSE 2-4 GM/100ML-% IV SOLN
2.0000 g | INTRAVENOUS | Status: AC
  Administered 2023-02-12: 2 g via INTRAVENOUS

## 2023-02-12 MED ORDER — MEPERIDINE HCL 25 MG/ML IJ SOLN: 6.2500 mg | INTRAMUSCULAR | Status: DC | PRN

## 2023-02-12 MED ORDER — HYDROMORPHONE HCL 1 MG/ML IJ SOLN
0.2500 mg | INTRAMUSCULAR | Status: DC | PRN
  Administered 2023-02-12 (×2): 0.25 mg via INTRAVENOUS

## 2023-02-12 MED ORDER — IBUPROFEN 200 MG PO TABS: 600.0000 mg | ORAL_TABLET | Freq: Four times a day (QID) | ORAL | Status: DC

## 2023-02-12 MED ORDER — OXYCODONE HCL 5 MG PO TABS: 5.0000 mg | ORAL_TABLET | Freq: Once | ORAL | Status: DC | PRN

## 2023-02-12 MED ORDER — VASOPRESSIN 20 UNIT/ML IV SOLN
INTRAVENOUS | Status: DC | PRN
  Administered 2023-02-12: 9 mL via INTRAMUSCULAR

## 2023-02-12 MED ORDER — DOCUSATE SODIUM 100 MG PO CAPS
100.0000 mg | ORAL_CAPSULE | Freq: Two times a day (BID) | ORAL | Status: DC
  Administered 2023-02-12 (×2): 100 mg via ORAL

## 2023-02-12 MED ORDER — SOD CITRATE-CITRIC ACID 500-334 MG/5ML PO SOLN: 30.0000 mL | ORAL | Status: DC

## 2023-02-12 MED ORDER — POVIDONE-IODINE 10 % EX SWAB: 2.0000 | Freq: Once | CUTANEOUS | Status: DC

## 2023-02-12 MED ORDER — DOCUSATE SODIUM 100 MG PO CAPS
ORAL_CAPSULE | ORAL | Status: AC
  Filled 2023-02-12: qty 1

## 2023-02-12 MED ORDER — ACETAMINOPHEN 500 MG PO TABS
1000.0000 mg | ORAL_TABLET | Freq: Four times a day (QID) | ORAL | Status: DC
  Administered 2023-02-12 – 2023-02-13 (×4): 1000 mg via ORAL

## 2023-02-12 MED ORDER — CEFAZOLIN SODIUM-DEXTROSE 2-4 GM/100ML-% IV SOLN
INTRAVENOUS | Status: AC
  Filled 2023-02-12: qty 100

## 2023-02-12 MED ORDER — LIDOCAINE HCL (PF) 2 % IJ SOLN
INTRAMUSCULAR | Status: AC
  Filled 2023-02-12: qty 5

## 2023-02-12 MED ORDER — ONDANSETRON HCL 4 MG/2ML IJ SOLN: 4.0000 mg | Freq: Four times a day (QID) | INTRAMUSCULAR | Status: DC | PRN

## 2023-02-12 MED ORDER — PHENYLEPHRINE 80 MCG/ML (10ML) SYRINGE FOR IV PUSH (FOR BLOOD PRESSURE SUPPORT)
PREFILLED_SYRINGE | INTRAVENOUS | Status: AC
  Filled 2023-02-12: qty 10

## 2023-02-12 MED ORDER — OXYCODONE HCL 5 MG/5ML PO SOLN: 5.0000 mg | Freq: Once | ORAL | Status: DC | PRN

## 2023-02-12 MED ORDER — ROCURONIUM BROMIDE 10 MG/ML (PF) SYRINGE
PREFILLED_SYRINGE | INTRAVENOUS | Status: AC
  Filled 2023-02-12: qty 10

## 2023-02-12 MED ORDER — PROPOFOL 10 MG/ML IV BOLUS
INTRAVENOUS | Status: DC | PRN
Start: 2023-02-12 — End: 2023-02-12
  Administered 2023-02-12: 180 mg via INTRAVENOUS

## 2023-02-12 MED ORDER — DEXMEDETOMIDINE HCL IN NACL 80 MCG/20ML IV SOLN
INTRAVENOUS | Status: DC | PRN
  Administered 2023-02-12: 8 ug via INTRAVENOUS
  Administered 2023-02-12: 12 ug via INTRAVENOUS
  Administered 2023-02-12: 8 ug via INTRAVENOUS

## 2023-02-12 MED ORDER — SIMETHICONE 80 MG PO CHEW
80.0000 mg | CHEWABLE_TABLET | Freq: Four times a day (QID) | ORAL | Status: DC | PRN
  Administered 2023-02-12: 80 mg via ORAL

## 2023-02-12 MED ORDER — KETOROLAC TROMETHAMINE 30 MG/ML IJ SOLN
30.0000 mg | Freq: Four times a day (QID) | INTRAMUSCULAR | Status: DC
  Administered 2023-02-12 – 2023-02-13 (×3): 30 mg via INTRAVENOUS

## 2023-02-12 MED ORDER — PHENYLEPHRINE 80 MCG/ML (10ML) SYRINGE FOR IV PUSH (FOR BLOOD PRESSURE SUPPORT)
PREFILLED_SYRINGE | INTRAVENOUS | Status: DC | PRN
  Administered 2023-02-12: 80 ug via INTRAVENOUS
  Administered 2023-02-12: 160 ug via INTRAVENOUS

## 2023-02-12 MED ORDER — OXYCODONE HCL 5 MG PO TABS
5.0000 mg | ORAL_TABLET | ORAL | Status: DC | PRN
  Administered 2023-02-12: 5 mg via ORAL
  Administered 2023-02-12: 10 mg via ORAL
  Administered 2023-02-12: 5 mg via ORAL
  Administered 2023-02-12 – 2023-02-13 (×2): 10 mg via ORAL

## 2023-02-12 MED ORDER — 0.9 % SODIUM CHLORIDE (POUR BTL) OPTIME
TOPICAL | Status: DC | PRN
  Administered 2023-02-12: 500 mL

## 2023-02-12 MED ORDER — HYDROMORPHONE HCL 1 MG/ML IJ SOLN
INTRAMUSCULAR | Status: AC
  Filled 2023-02-12: qty 1

## 2023-02-12 MED ORDER — MIDAZOLAM HCL 5 MG/5ML IJ SOLN
INTRAMUSCULAR | Status: DC | PRN
  Administered 2023-02-12: 2 mg via INTRAVENOUS

## 2023-02-12 MED ORDER — FENTANYL CITRATE (PF) 100 MCG/2ML IJ SOLN
INTRAMUSCULAR | Status: DC | PRN
  Administered 2023-02-12: 50 ug via INTRAVENOUS
  Administered 2023-02-12: 100 ug via INTRAVENOUS

## 2023-02-12 MED ORDER — FENTANYL CITRATE (PF) 250 MCG/5ML IJ SOLN
INTRAMUSCULAR | Status: AC
  Filled 2023-02-12: qty 5

## 2023-02-12 MED ORDER — ARTIFICIAL TEARS OPHTHALMIC OINT
TOPICAL_OINTMENT | OPHTHALMIC | Status: AC
  Filled 2023-02-12: qty 3.5

## 2023-02-12 MED ORDER — MIDAZOLAM HCL 2 MG/2ML IJ SOLN
INTRAMUSCULAR | Status: AC
  Filled 2023-02-12: qty 2

## 2023-02-12 MED ORDER — ONDANSETRON HCL 4 MG PO TABS
4.0000 mg | ORAL_TABLET | Freq: Four times a day (QID) | ORAL | Status: DC | PRN
  Administered 2023-02-13: 4 mg via ORAL

## 2023-02-12 MED ORDER — DEXAMETHASONE SODIUM PHOSPHATE 10 MG/ML IJ SOLN
INTRAMUSCULAR | Status: DC | PRN
  Administered 2023-02-12: 10 mg via INTRAVENOUS

## 2023-02-12 SURGICAL SUPPLY — 33 items
BAG DECANTER FOR FLEXI CONT (MISCELLANEOUS) IMPLANT
BLADE SURG 15 STRL LF DISP TIS (BLADE) ×4 IMPLANT
BLADE SURG 15 STRL SS (BLADE) ×3
ELECT BLADE TIP CTD 4 INCH (ELECTRODE) IMPLANT
GAUZE 4X4 16PLY ~~LOC~~+RFID DBL (SPONGE) ×4 IMPLANT
GLOVE BIO SURGEON STRL SZ 6 (GLOVE) ×4 IMPLANT
GLOVE BIO SURGEON STRL SZ 6.5 (GLOVE) IMPLANT
GLOVE BIO SURGEON STRL SZ7 (GLOVE) IMPLANT
GLOVE BIOGEL PI IND STRL 6 (GLOVE) IMPLANT
GLOVE BIOGEL PI IND STRL 6.5 (GLOVE) ×4 IMPLANT
GLOVE BIOGEL PI IND STRL 7.0 (GLOVE) ×4 IMPLANT
GLOVE SURG SS PI 7.5 STRL IVOR (GLOVE) IMPLANT
GOWN STRL REUS W/TWL LRG LVL3 (GOWN DISPOSABLE) ×16 IMPLANT
IV NS 1000ML (IV SOLUTION) ×3
IV NS 1000ML BAXH (IV SOLUTION) IMPLANT
KIT TURNOVER CYSTO (KITS) ×4 IMPLANT
LIGASURE IMPACT 36 18CM CVD LR (INSTRUMENTS) IMPLANT
NS IRRIG 1000ML POUR BTL (IV SOLUTION) ×4 IMPLANT
NS IRRIG 500ML POUR BTL (IV SOLUTION) IMPLANT
PACK VAGINAL WOMENS (CUSTOM PROCEDURE TRAY) ×4 IMPLANT
PAD OB MATERNITY 4.3X12.25 (PERSONAL CARE ITEMS) ×4 IMPLANT
SET IRRIG Y TYPE TUR BLADDER L (SET/KITS/TRAYS/PACK) ×4 IMPLANT
SLEEVE SCD COMPRESS KNEE MED (STOCKING) ×4 IMPLANT
SUT VIC AB 1 CT1 18XBRD ANBCTR (SUTURE) ×12 IMPLANT
SUT VIC AB 1 CT1 8-18 (SUTURE) ×9
SUT VIC AB 2-0 CT1 27 (SUTURE) ×9
SUT VIC AB 2-0 CT1 TAPERPNT 27 (SUTURE) ×4 IMPLANT
SUT VICRYL 0 TIES 12 18 (SUTURE) ×4 IMPLANT
SYR 5ML LL (SYRINGE) IMPLANT
TOWEL OR 17X24 6PK STRL BLUE (TOWEL DISPOSABLE) ×4 IMPLANT
TRAY FOLEY W/BAG SLVR 14FR LF (SET/KITS/TRAYS/PACK) ×4 IMPLANT
TUBE CONNECTING 12X1/4 (SUCTIONS) IMPLANT
YANKAUER SUCT BULB TIP NO VENT (SUCTIONS) IMPLANT

## 2023-02-12 NOTE — Transfer of Care (Signed)
Immediate Anesthesia Transfer of Care Note  Patient: Bailey Baker  Procedure(s) Performed: HYSTERECTOMY VAGINAL (Bilateral: Vagina ) CYSTOSCOPY (Bladder) OVARIAN CYSTECTOMY (Vagina )  Patient Location: PACU  Anesthesia Type:General  Level of Consciousness: sedated  Airway & Oxygen Therapy: Patient Spontanous Breathing and Patient connected to nasal cannula oxygen  Post-op Assessment: Report given to RN  Post vital signs: Reviewed and stable  Last Vitals:  Vitals Value Taken Time  BP 135/76 02/12/23 0945  Temp    Pulse 90 02/12/23 0946  Resp 22 02/12/23 0946  SpO2 100 % 02/12/23 0946  Vitals shown include unfiled device data.  Last Pain:  Vitals:   02/12/23 0600  TempSrc: Oral  PainSc: 0-No pain      Patients Stated Pain Goal: 8 (02/12/23 0600)  Complications: No notable events documented.

## 2023-02-12 NOTE — Anesthesia Procedure Notes (Signed)
Procedure Name: Intubation Date/Time: 02/12/2023 7:36 AM  Performed by: Briant Sites, CRNAPre-anesthesia Checklist: Patient identified, Emergency Drugs available, Suction available and Patient being monitored Patient Re-evaluated:Patient Re-evaluated prior to induction Oxygen Delivery Method: Circle system utilized Preoxygenation: Pre-oxygenation with 100% oxygen Induction Type: IV induction Ventilation: Mask ventilation without difficulty Laryngoscope Size: Mac and 3 Grade View: Grade I Tube type: Oral Tube size: 7.0 mm Number of attempts: 1 Airway Equipment and Method: Stylet and Oral airway Placement Confirmation: ETT inserted through vocal cords under direct vision, positive ETCO2 and breath sounds checked- equal and bilateral Secured at: 21 cm Tube secured with: Tape Dental Injury: Teeth and Oropharynx as per pre-operative assessment

## 2023-02-12 NOTE — Interval H&P Note (Signed)
History and Physical Interval Note:  02/12/2023 7:28 AM  Bailey Baker  has presented today for surgery, with the diagnosis of abnormal uterine bleeding.  The various methods of treatment have been discussed with the patient and family. After consideration of risks, benefits and other options for treatment, the patient has consented to  Procedure(s): HYSTERECTOMY VAGINAL,  POSSIBLE BILATERAL SALPINGECTOMY (Bilateral) CYSTOSCOPY (N/A) as a surgical intervention.  The patient's history has been reviewed, patient examined, no change in status, stable for surgery.  I have reviewed the patient's chart and labs.  Questions were answered to the patient's satisfaction.  No bleeding today, all questions answered   Francisco Eyerly M Vernal Rutan

## 2023-02-12 NOTE — Anesthesia Postprocedure Evaluation (Signed)
Anesthesia Post Note  Patient: Bailey Baker  Procedure(s) Performed: HYSTERECTOMY VAGINAL (Bilateral: Vagina ) CYSTOSCOPY (Bladder) OVARIAN CYSTECTOMY (Vagina )     Patient location during evaluation: PACU Anesthesia Type: General Level of consciousness: awake and alert Pain management: pain level controlled Vital Signs Assessment: post-procedure vital signs reviewed and stable Respiratory status: spontaneous breathing, nonlabored ventilation and respiratory function stable Cardiovascular status: blood pressure returned to baseline and stable Postop Assessment: no apparent nausea or vomiting Anesthetic complications: no   No notable events documented.  Last Vitals:  Vitals:   02/12/23 1114 02/12/23 1142  BP: 123/76 122/70  Pulse: 88 88  Resp:  15  Temp: 36.8 C 36.9 C  SpO2: 98% 96%    Last Pain:  Vitals:   02/12/23 1146  TempSrc:   PainSc: 5                  Lowella Curb

## 2023-02-12 NOTE — Op Note (Signed)
02/12/23  Surgeon: Ellison Hughs, MD Assist: Sherian Rein, MD Procedure: Total vaginal hysterectomy; cystoscopy, right ovarian cystotomy Indications: Persistent AUB, chronic cervical inflammation, satisfied fertility EBL: 50cc IVF: 750 UOP: 600 Specimens: Uterus and cervix to pathology Findings:External genitalia WNL. On bimanual exam, mobile, anteverted uterus approx 7-8cm. Right adnexa with some fullness. After removal of uterus and cervix, 4-5cm simple rt ovarian cyst noted consistent with this exam finding.   Ms Babinski is a 32yo [phone] with  AUB-HMB-A with worsening dysmenorrhea and c/f new polyp formation on TVUS despite Mirena. 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 Desired definitive management in form of hysterectomy.  Consent: Risks of TVH include infection of the uterus, pelvic organs, or skin, inadvertent injury to internal organs, such as bowel or bladder. If there is major injury, extensive surgery may be required. If injury is minor, it may be treated with relative ease. Discussed possibility of excessive blood loss and transfusion. Patient aware that no future fertility will remain after procedure. Patient accepts the possibility of blood transfusion, if necessary. Bowel and/or bladder injury may require prolonged inpatient stay and possible colostomy, Foley catheter, etc, as deemed fit by other surgeon. Patient understands and agrees to move forward with surgery.   Operative technique: Patient was taken to the operating room where she was placed in dorsolithotomy position with adequate padding of all pressure points.  General anesthesia was established without issue.  Preoperative antibiotics were given per guidelines.  She was prepped and draped in normal sterile fashion  A weighted speculum was placed in the posterior fornix with Deaver displacing bladder anteriorly after a Foley catheter was placed.  Using a spinal needle, 10 cc of 20 units of Pitressin and 100 mL  normal saline was used to hydrodissect the cervicovaginal junction. A semilunar incision was made in the mucosa of the vaginal fornix below the attachment of the bladder using Bovie cautery.  The remainder of the bladder was freed from the anterior surface of the uterus by subsequent sharp and blunt dissection at the plica vesicouterine.  After the loose areolar plane was entered, the fascial dissection was completed.  The cervix was then pulled forward and a semilunar incision was made and joined on each of the sides of the cervix and connected to the height of the posterior fornix. The anterior and posterior incisions were then joined on each side of the cervix.  Care was taken to avoid injury to the rectum.  After cutting the mucosa, the uterosacral ligaments were then identified and clamped using Heaney clamps.  They were then cut and transfixed using 0 Vicryl sutures.  Bilateral ligaments were tagged with hemostats.  The bases of the cardinal ligaments were then clamped and cut using Heaney clamps and similarly transfixed.  Similar clamp-cut-suture technique was used until bilateral cornua were reached.  Zeppelin clamp was placed across left adnexa which was then Heaney sutured and transfixed, flashing to ensure hemostasis.  This process was repeated on the right adnexa.  Each side required two bites in order to fully transect. Upon completion of final side, a thin band of peritoneum was noted from uterine fundus to a simple right ovarian cyst. This was suture ligated using a tonsil with excellent hemostasis. The uterus was noted to be adequately mobile and delivered easily through the vagina.  All specimens were then passed off to pathology.  Bovie cautery used to create cystotomy in aforementioned cyst, which was then drained via suction of clear serous fluid. Given oozing, posterior  cuff was run using swedged on 2-0 vicryl. A 0 vicryl was used via figure-of-eights for spot-hemostasis near cuff angles, taking  care to use shallow bites. Bilateral uterosacral ligament tags were tied together for increased apical support.  The vaginal cuff was then closed hosrisontally using 2-0 Vicryl suture in a running locked fashion. This process took additional time as patient had very redundant lateral wall vaginal mucosa. Good hemostasis was confirmed.  Cystoscopy was then carried out.   The Foley catheter was then removed and 30 degree cystoscope inserted into the urethra.  Upon insertion of scope, positive bubble sign.  No evidence of suture or gross injury or bleeding.  Bilateral efflux of urine was noted from both left and right ureteral orifices.  Total fluid instilled into bladder was 200 cc.  The Foley was then replaced at the end of cystoscopy.  Patient was transferred to PACU in stable condition. All counts correct x2.

## 2023-02-12 NOTE — Anesthesia Preprocedure Evaluation (Signed)
Anesthesia Evaluation  Patient identified by MRN, date of birth, ID band Patient awake    Reviewed: Allergy & Precautions, NPO status , Patient's Chart, lab work & pertinent test results  Airway Mallampati: II  TM Distance: >3 FB Neck ROM: Full    Dental  (+) Teeth Intact, Dental Advisory Given   Pulmonary asthma , former smoker   Pulmonary exam normal breath sounds clear to auscultation       Cardiovascular negative cardio ROS Normal cardiovascular exam Rhythm:Regular Rate:Normal     Neuro/Psych negative neurological ROS  negative psych ROS   GI/Hepatic Neg liver ROS,GERD  Medicated and Controlled,,  Endo/Other  Obesity BMI 38  Renal/GU negative Renal ROS  negative genitourinary   Musculoskeletal negative musculoskeletal ROS (+)    Abdominal  (+) + obese  Peds  Hematology negative hematology ROS (+)   Anesthesia Other Findings   Reproductive/Obstetrics negative OB ROS                             Anesthesia Physical Anesthesia Plan  ASA: 2  Anesthesia Plan: General   Post-op Pain Management: Toradol IV (intra-op)* and Dilaudid IV   Induction: Intravenous  PONV Risk Score and Plan: 3 and Ondansetron, Dexamethasone, Midazolam and Treatment may vary due to age or medical condition  Airway Management Planned: Oral ETT  Additional Equipment: None  Intra-op Plan:   Post-operative Plan: Extubation in OR  Informed Consent: I have reviewed the patients History and Physical, chart, labs and discussed the procedure including the risks, benefits and alternatives for the proposed anesthesia with the patient or authorized representative who has indicated his/her understanding and acceptance.     Dental advisory given  Plan Discussed with: CRNA  Anesthesia Plan Comments:        Anesthesia Quick Evaluation

## 2023-02-12 NOTE — Progress Notes (Signed)
POD #0  Subjective:  No acute events.  Pt denies problems with ambulating, voiding or po intake.  She denies nausea or vomiting.  Pain is well controlled.  She has had flatus. She has had bowel movement.   Objective: Blood pressure 114/65, pulse 97, temperature 99.1 F (37.3 C), resp. rate 16, height 5' 2.5" (1.588 m), weight 94.2 kg, SpO2 98%.  Physical Exam:  General: alert, cooperative and no distress Lochia:normal flow Chest: CTAB Heart: RRR no m/r/g Abdomen: +BS, soft, nontender Extremities: neg edema, neg calf TTP BL, neg Homans BL  No results for input(s): "HGB", "HCT" in the last 72 hours.  Assessment/Plan:  ASSESSMENT: Bailey Baker is a 32 y.o. [phone] s/p TVH/cysto/ovarian cystotomy for AUB/symptomatic cyst.  -Continue PO pain meds -D/C Foley and pend spontaneous void -Encourage ambulation and incentive spirometry -Anticipate DC home POD1   LOS: 0 days

## 2023-02-13 ENCOUNTER — Encounter (HOSPITAL_BASED_OUTPATIENT_CLINIC_OR_DEPARTMENT_OTHER): Payer: Self-pay | Admitting: Obstetrics and Gynecology

## 2023-02-13 DIAGNOSIS — N938 Other specified abnormal uterine and vaginal bleeding: Secondary | ICD-10-CM | POA: Diagnosis not present

## 2023-02-13 MED ORDER — OXYCODONE HCL 5 MG PO TABS
ORAL_TABLET | ORAL | Status: AC
  Filled 2023-02-13: qty 2

## 2023-02-13 MED ORDER — OXYCODONE HCL 5 MG PO TABS: 5.0000 mg | ORAL_TABLET | Freq: Four times a day (QID) | ORAL | 0 refills | Status: AC | PRN

## 2023-02-13 MED ORDER — KETOROLAC TROMETHAMINE 30 MG/ML IJ SOLN
INTRAMUSCULAR | Status: AC
  Filled 2023-02-13: qty 1

## 2023-02-13 MED ORDER — ACETAMINOPHEN 500 MG PO TABS
ORAL_TABLET | ORAL | Status: AC
  Filled 2023-02-13: qty 2

## 2023-02-13 MED ORDER — ONDANSETRON HCL 4 MG PO TABS
ORAL_TABLET | ORAL | Status: AC
  Filled 2023-02-13: qty 1

## 2023-02-13 MED ORDER — IBUPROFEN 800 MG PO TABS: 800.0000 mg | ORAL_TABLET | Freq: Three times a day (TID) | ORAL | 1 refills | Status: AC

## 2023-02-13 NOTE — Discharge Summary (Signed)
Physician Discharge Summary  Patient ID: Bailey Baker MRN: [id number] DOB/AGE: June 21, 1991 32 y.o.  Admit date: 02/12/2023 Discharge date: 02/13/2023  Admission Diagnoses:  Discharge Diagnoses:  Principal Problem:   Abnormal uterine bleeding (AUB)   Discharged Condition: good  Hospital Course: Admitted for scheduled TVH/BS for AUB. Uncomplicated procedure, please see operative note for full details. By POD#1, ambulating, tolerating PO, voiding, pain controlled on PO meds. Discharged home in stable fashion with routine precautions Discharge Exam: Blood pressure (!) 102/56, pulse 82, temperature 97.9 F (36.6 C), temperature source Oral, resp. rate 18, height 5' 2.5" (1.588 m), weight 94.2 kg, SpO2 98%. General: alert, cooperative and no distress Lochia:normal flow Chest: CTAB Heart: RRR no m/r/g Abdomen: +BS, soft, nontender Extremities: neg edema, neg calf TTP BL, neg Homans BL  Disposition: Discharge disposition: 01-Home or Self Care       Discharge Instructions     Call MD for:  difficulty breathing, headache or visual disturbances   Complete by: As directed    Call MD for:  extreme fatigue   Complete by: As directed    Call MD for:  persistant nausea and vomiting   Complete by: As directed    Call MD for:  redness, tenderness, or signs of infection (pain, swelling, redness, odor or green/yellow discharge around incision site)   Complete by: As directed    Call MD for:  severe uncontrolled pain   Complete by: As directed    Call MD for:  temperature >100.4   Complete by: As directed    Diet - low sodium heart healthy   Complete by: As directed    Increase activity slowly   Complete by: As directed    Lifting restrictions   Complete by: As directed    No >15lbs   Sexual Activity Restrictions   Complete by: As directed    None for 6wks      Allergies as of 02/13/2023       Reactions   Milk-related Compounds Swelling, Rash        Medication List      TAKE these medications    fluticasone 220 MCG/ACT inhaler Commonly known as: FLOVENT HFA Inhale 1 puff into the lungs at bedtime.   ibuprofen 800 MG tablet Commonly known as: ADVIL Take 1 tablet (800 mg total) by mouth 3 (three) times daily. What changed:  when to take this reasons to take this   oxyCODONE 5 MG immediate release tablet Commonly known as: Oxy IR/ROXICODONE Take 1 tablet (5 mg total) by mouth every 6 (six) hours as needed for severe pain.         Signed: Valerie Roys Sierrah Luevano 02/13/2023, 8:09 AM

## 2023-02-13 NOTE — Progress Notes (Signed)
POD #1  Subjective:  No acute events overnight.  Pt denies problems with ambulating, voiding or po intake.  She denies nausea or vomiting.  Pain is well controlled.  She has had flatus. She has had bowel movement. Pain inproved from alst night  Objective: Blood pressure (!) 102/56, pulse 82, temperature 97.9 F (36.6 C), temperature source Oral, resp. rate 18, height 5' 2.5" (1.588 m), weight 94.2 kg, SpO2 98%.  Physical Exam:  General: alert, cooperative and no distress Lochia:normal flow Chest: CTAB Heart: RRR no m/r/g Abdomen: +BS, soft, nontender Extremities: neg edema, neg calf TTP BL, neg Homans BL  Recent Labs    02/13/23 0325  HGB 11.2*  HCT 33.7*    Assessment/Plan:  ASSESSMENT: Bailey Baker is a 32 y.o. [id number] s/p TVH/cysto/ovarian cystotomy for AUB/symptomatic cyst.  -Continue PO pain meds -spontaneously voiding -Encourage ambulation and incentive spirometry -DC home today POD#1   LOS: 0 days
